# Patient Record
Sex: Female | Born: 1965 | Race: White | Hispanic: No | Smoking: Never smoker
Health system: Southern US, Community
[De-identification: ages and names within clinical notes are randomized; demographics above are authoritative.]

## PROBLEM LIST (undated history)

## (undated) DIAGNOSIS — K8020 Calculus of gallbladder without cholecystitis without obstruction: Secondary | ICD-10-CM

## (undated) DIAGNOSIS — K219 Gastro-esophageal reflux disease without esophagitis: Secondary | ICD-10-CM

## (undated) DIAGNOSIS — E119 Type 2 diabetes mellitus without complications: Secondary | ICD-10-CM

## (undated) HISTORY — DX: Gastro-esophageal reflux disease without esophagitis: K21.9

## (undated) HISTORY — PX: TOTAL VAGINAL HYSTERECTOMY: SHX2548

## (undated) HISTORY — PX: ABDOMINAL HYSTERECTOMY: SHX81

## (undated) HISTORY — DX: Type 2 diabetes mellitus without complications: E11.9

## (undated) HISTORY — PX: TUBAL LIGATION: SHX77

---

## 2013-01-31 ENCOUNTER — Emergency Department: Payer: Self-pay

## 2013-01-31 LAB — URINALYSIS, COMPLETE
Bilirubin,UR: NEGATIVE
Blood: NEGATIVE
Glucose,UR: NEGATIVE mg/dL (ref 0–75)
Ketone: NEGATIVE
Leukocyte Esterase: NEGATIVE
Nitrite: NEGATIVE
Ph: 6 (ref 4.5–8.0)
Protein: NEGATIVE
Squamous Epithelial: 1
WBC UR: <1 /HPF (ref 0–5)

## 2013-01-31 LAB — WET PREP, GENITAL

## 2013-01-31 LAB — COMPREHENSIVE METABOLIC PANEL
Albumin: 3.9 g/dL (ref 3.4–5.0)
Alkaline Phosphatase: 94 U/L (ref 50–136)
SGPT (ALT): 32 U/L (ref 12–78)
Total Protein: 8.1 g/dL (ref 6.4–8.2)

## 2013-01-31 LAB — CBC
MCH: 30.1 pg (ref 26.0–34.0)
Platelet: 315 10*3/uL (ref 150–440)
RBC: 4.73 10*6/uL (ref 3.80–5.20)
RDW: 13.8 % (ref 11.5–14.5)

## 2013-01-31 LAB — LIPASE, BLOOD: Lipase: 153 U/L (ref 73–393)

## 2014-12-30 ENCOUNTER — Emergency Department: Payer: Self-pay | Admitting: Emergency Medicine

## 2015-06-09 ENCOUNTER — Encounter (INDEPENDENT_AMBULATORY_CARE_PROVIDER_SITE_OTHER): Payer: Self-pay

## 2015-06-09 ENCOUNTER — Encounter: Payer: Self-pay | Admitting: *Deleted

## 2015-06-09 ENCOUNTER — Ambulatory Visit: Payer: Self-pay | Attending: Oncology | Admitting: *Deleted

## 2015-06-09 VITALS — BP 139/85 | HR 91 | Temp 96.7°F | Ht 67.0 in | Wt 239.3 lb

## 2015-06-09 DIAGNOSIS — N644 Mastodynia: Secondary | ICD-10-CM

## 2015-06-09 NOTE — Progress Notes (Signed)
Subjective:     Patient ID: Tamara Gibson, female   DOB: 04/06/1966, 49 y.o.   MRN: [id number]  HPI   Review of Systems     Objective:   Physical Exam  Pulmonary/Chest: Right breast exhibits tenderness. Right breast exhibits no inverted nipple, no mass, no nipple discharge and no skin change. Left breast exhibits no inverted nipple, no mass, no nipple discharge, no skin change and no tenderness. Breasts are symmetrical.         Assessment:     49 year old White female presents to [facility] for clinical breast exam and mammogram.  Patient complains of intermittent targeted right breast pain in the upper outer quadrant times six months.  States at times "I can't lay my arm by my side because it's so tender".  States she drinks 2 cups of coffee, and about 6 cokes a day.  Patient had a hysterectomy for fibroids and has only 1 ovary. Family history includes her mom with ovarian cancer at age 42.  She is still living.   On clinical breast exam the right breast is slightly tender to palpation.  There is no dominant mass, skin changes, nipple discharge or lymphadenopathy.  Taught self breast awareness. Patient has been screened for eligibility.  She does not have any insurance, Medicare or Medicaid.  She also meets financial eligibility.  Hand-out given on the Affordable Care Act.    Plan:     Will get bilateral diagnostic mammogram and ultrasound for targeted right breast pain.  Encouraged to decrease caffeine intake.  She is to call me back if her breast pain worsens, and we will re-evaluate at that time.  Will follow-up per protocol.

## 2015-06-09 NOTE — Patient Instructions (Signed)
Gave patient hand-out, Women Staying Healthy, Active and Well from [facility], with education on breast health, pap smears, heart and colon health. 

## 2015-06-11 ENCOUNTER — Ambulatory Visit
Admission: RE | Admit: 2015-06-11 | Discharge: 2015-06-11 | Disposition: A | Discharge status: Home / Self Care | Payer: Self-pay | Source: Ambulatory Visit | Attending: Oncology | Admitting: Oncology

## 2015-06-11 ENCOUNTER — Encounter: Payer: Self-pay | Admitting: *Deleted

## 2015-06-11 DIAGNOSIS — N644 Mastodynia: Secondary | ICD-10-CM

## 2015-06-11 NOTE — Progress Notes (Signed)
Letter mailed from the [facility] to inform patient of her normal mammogram results.  Patient is to follow-up with annual screening in one year.  HSIS to Christy. 

## 2016-05-25 ENCOUNTER — Emergency Department: Payer: Self-pay

## 2016-05-25 ENCOUNTER — Emergency Department
Admission: EM | Admit: 2016-05-25 | Discharge: 2016-05-25 | Disposition: A | Discharge status: Home / Self Care | Payer: Self-pay | Attending: Emergency Medicine | Admitting: Emergency Medicine

## 2016-05-25 ENCOUNTER — Encounter: Payer: Self-pay | Admitting: Emergency Medicine

## 2016-05-25 DIAGNOSIS — R0789 Other chest pain: Secondary | ICD-10-CM | POA: Insufficient documentation

## 2016-05-25 HISTORY — DX: Calculus of gallbladder without cholecystitis without obstruction: K80.20

## 2016-05-25 LAB — BASIC METABOLIC PANEL
Anion gap: 7 (ref 5–15)
BUN: 16 mg/dL (ref 6–20)
CALCIUM: 9.4 mg/dL (ref 8.9–10.3)
CHLORIDE: 108 mmol/L (ref 101–111)
CO2: 25 mmol/L (ref 22–32)
CREATININE: 0.7 mg/dL (ref 0.44–1.00)
GFR calc non Af Amer: >60 mL/min (ref >60)
GLUCOSE: 108 mg/dL — AB (ref 65–99)
Potassium: 3.7 mmol/L (ref 3.5–5.1)
Sodium: 140 mmol/L (ref 135–145)

## 2016-05-25 LAB — CBC
HCT: 42.3 % (ref 35.0–47.0)
Hemoglobin: 14.5 g/dL (ref 12.0–16.0)
MCH: 29.5 pg (ref 26.0–34.0)
MCHC: 34.3 g/dL (ref 32.0–36.0)
MCV: 85.9 fL (ref 80.0–100.0)
PLATELETS: 309 10*3/uL (ref 150–440)
RBC: 4.92 MIL/uL (ref 3.80–5.20)
RDW: 13.6 % (ref 11.5–14.5)
WBC: 8.3 10*3/uL (ref 3.6–11.0)

## 2016-05-25 LAB — TROPONIN I

## 2016-05-25 LAB — FIBRIN DERIVATIVES D-DIMER (ARMC ONLY): Fibrin derivatives D-dimer (ARMC): 356 (ref 0–499)

## 2016-05-25 NOTE — ED Triage Notes (Signed)
Pt comes into the ED via POV c/o left sided rib pain.  Patient denies injury to the rib but explains it as a sharp pain under the rib that has caused some mild shortness of breath, dizziness, and nausea.  Patient ambulated well to the room in NAD at this time.  States she thinks the area is slightly swollen and bruised.

## 2016-05-25 NOTE — ED Provider Notes (Signed)
[facility] Emergency Department Provider Note   ____________________________________________   First MD Initiated Contact with Patient 05/25/16 2055     (approximate)  I have reviewed the triage vital signs and the nursing notes.   HISTORY  Chief Complaint Chest Pain   HPI Tamara HootsCheryl Gibson is a 50 y.o. female without any chronic medical problems was presenting to the emergency department with 1 week of left-sided chest pain. Says the pain is cramping and worse with coughing and deep breathing. She says that it is worse at night and with movement. She says that she has had a sore throat recently and a mild cough. Denies any injury. Says that she has been taking NSAIDs which have been relieving the pain mildly. However, she cannot note any exact cause for the pain. She thinks that it may have been a muscle pull but after a week of symptoms she is unsure. She says that she also thought that she saw swelling this morning over the area of the left side of the chest where the pain is ongoing. She denies any radiation of pain. Denies any shortness of breath. No nausea, vomiting or diaphoresis.   Past Medical History:  Diagnosis Date  . Gallstones     There are no active problems to display for this patient.   Past Surgical History:  Procedure Laterality Date  . ABDOMINAL HYSTERECTOMY      Prior to Admission medications   Not on File    Allergies Review of patient's allergies indicates no known allergies.  Family History  Problem Relation Age of Onset  . Breast cancer Neg Hx     Social History Social History  Substance Use Topics  . Smoking status: Never Smoker  . Smokeless tobacco: Never Used  . Alcohol use Yes    Review of Systems Constitutional: No fever/chills Eyes: No visual changes. ENT: As above Cardiovascular: As above Respiratory: Denies shortness of breath. Gastrointestinal: No abdominal pain.  No nausea, no vomiting.  No  diarrhea.  No constipation. Genitourinary: Negative for dysuria. Musculoskeletal: Negative for back pain. Skin: Negative for rash. Neurological: Negative for headaches, focal weakness or numbness.  10-point ROS otherwise negative.  ____________________________________________   PHYSICAL EXAM:  VITAL SIGNS: ED Triage Vitals  Enc Vitals Group     BP 05/25/16 1806 (!) 160/87     Pulse Rate 05/25/16 1806 99     Resp 05/25/16 1806 20     Temp 05/25/16 1806 98.4 F (36.9 C)     Temp Source 05/25/16 1806 Oral     SpO2 05/25/16 1806 97 %     Weight 05/25/16 1807 240 lb (108.9 kg)     Height 05/25/16 1807 5\' 7"  (1.702 m)     Head Circumference --      Peak Flow --      Pain Score 05/25/16 1807 7     Pain Loc --      Pain Edu? --      Excl. in GC? --     Constitutional: Alert and oriented. Well appearing and in no acute distress. Eyes: Conjunctivae are normal. PERRL. EOMI. Head: Atraumatic. Nose: No congestion/rhinnorhea. Mouth/Throat: Mucous membranes are moist.  Oropharynx non-erythematous. Neck: No stridor.   Cardiovascular: Normal rate, regular rhythm. Grossly normal heart sounds.  Lateral tenderness over the lateral ninth and 10th ribs without any overlying ecchymosis. No crepitus. Respiratory: Normal respiratory effort.  No retractions. Lungs CTAB. Gastrointestinal: Soft and nontender. No distention.  Musculoskeletal: No lower  extremity tenderness nor edema.  No joint effusions. Neurologic:  Normal speech and language. No gross focal neurologic deficits are appreciated. Skin:  Skin is warm, dry and intact. No rash noted. Psychiatric: Mood and affect are normal. Speech and behavior are normal.  ____________________________________________   LABS (all labs ordered are listed, but only abnormal results are displayed)  Labs Reviewed  BASIC METABOLIC PANEL - Abnormal; Notable for the following:       Result Value   Glucose, Bld 108 (*)    All other components within  normal limits  CBC  TROPONIN I  FIBRIN DERIVATIVES D-DIMER ([facility] ONLY)   ____________________________________________  EKG  ED ECG REPORT I, Arelia LongestSchaevitz,  Taziah Difatta M, the attending physician, personally viewed and interpreted this ECG.   Date: 05/25/2016  EKG Time: 1817  Rate: 90  Rhythm: normal sinus rhythm  Axis: Normal  Intervals:none  ST&T Change: No ST segment elevation or depression. No abnormal T-wave inversion.  ____________________________________________  RADIOLOGY  DG Chest 2 View (Accession [id number]) (Order [id number])  Imaging  Date: 05/25/2016 Department: [facility] EMERGENCY DEPARTMENT Released By: Domenick GongAmy K Moffitt, RN (auto-released) Authorizing: Nita Sicklearolina Veronese, MD  PACS Images   Show images for DG Chest 2 View  Study Result   CLINICAL DATA:  Left-sided rib pain. Mild shortness of breath and dizziness.  EXAM: CHEST  2 VIEW  COMPARISON:  None.  FINDINGS: The heart size and mediastinal contours are within normal limits. Both lungs are clear. The visualized skeletal structures are unremarkable.  IMPRESSION: Negative two view chest x-ray.   Electronically Signed   By: Marin Robertshristopher  Mattern M.D.   On: 05/25/2016 18:38     ____________________________________________   PROCEDURES  Procedure(s) performed:   Procedures  Critical Care performed:   ____________________________________________   INITIAL IMPRESSION / ASSESSMENT AND PLAN / ED COURSE  Pertinent labs & imaging results that were available during my care of the patient were reviewed by me and considered in my medical decision making (see chart for details).  ----------------------------------------- 11:06 PM on 05/25/2016 -----------------------------------------  Patient is resting currently at this time. Very reassuring lab work including a negative d-dimer. Likely chest wall pain. Splint results the patient as well as the diagnosis. She'll continue  to take ibuprofen and we'll try to salvage addition such as BenGay or icy hot. She is understanding of the plan and willing to comply.  Clinical Course     ____________________________________________   FINAL CLINICAL IMPRESSION(S) / ED DIAGNOSES  Chest wall pain.    NEW MEDICATIONS STARTED DURING THIS VISIT:  New Prescriptions   No medications on file     Note:  This document was prepared using Dragon voice recognition software and may include unintentional dictation errors.    Myrna Blazeravid Matthew Parish Augustine, MD 05/25/16 [phone]

## 2016-05-25 NOTE — ED Notes (Signed)
Pt in via triage with complaints of pain "under left rib" x approximately one week.  Pt initially thought she pulled something, but pain has just gotten worse.  Pt states pain is worse when she coughs or sneezes, states difficulty laying on that side at night due to pain.  Pt denies any pain at this time.  Pt A/Ox4, ambulatory to room, no immediate distress at this time.

## 2016-09-06 IMAGING — US ABDOMEN ULTRASOUND LIMITED
1 series · 14 of 25 positions shown · non-contrast
Comparison: None.

CLINICAL DATA: Right upper quadrant pain, known gallstones.

EXAM:
US ABDOMEN LIMITED - RIGHT UPPER QUADRANT

[Series 1: abdomen ultrasound limited · 0.21mm/px · 14 of 52 slices shown]
[im 1/52]
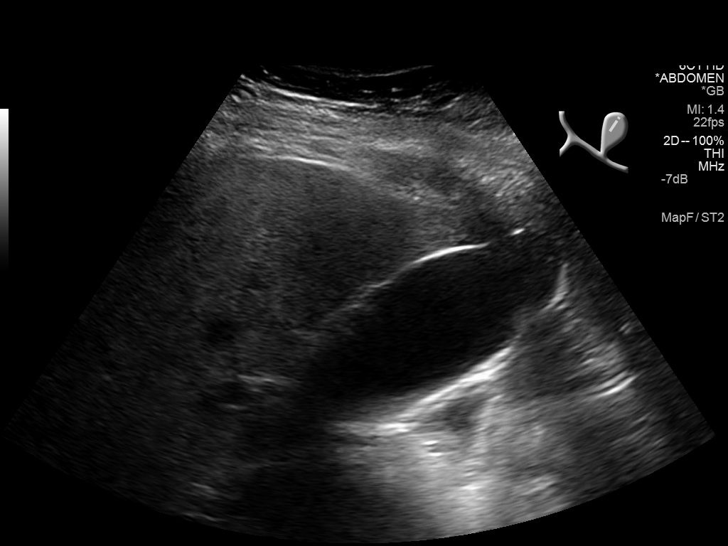
[im 5/52]
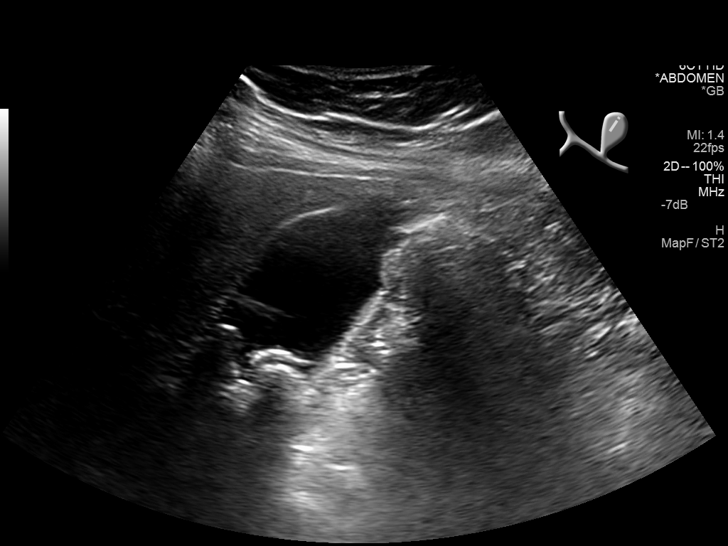
[im 9/52]
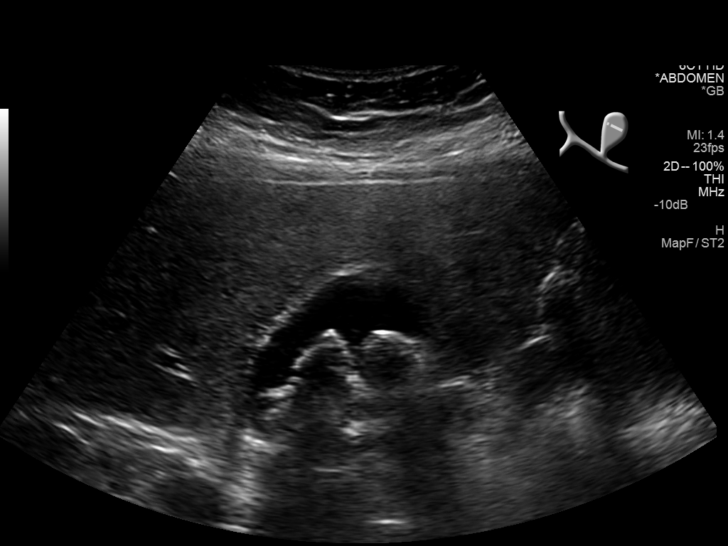
[im 13/52]
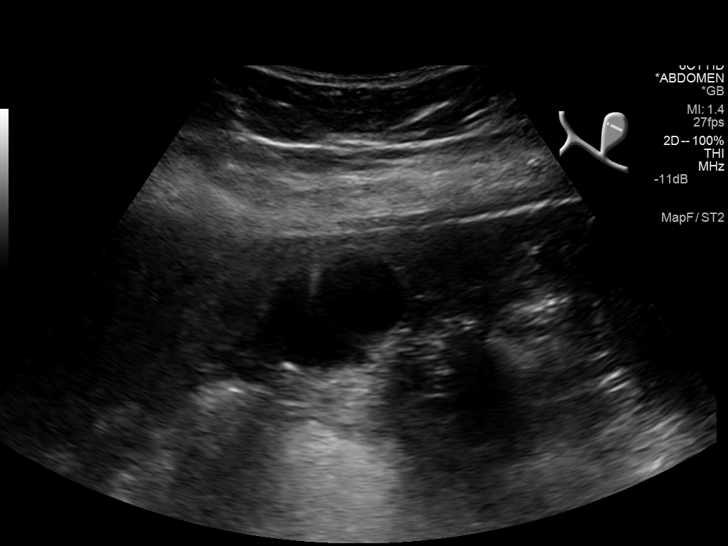
[im 18/52]
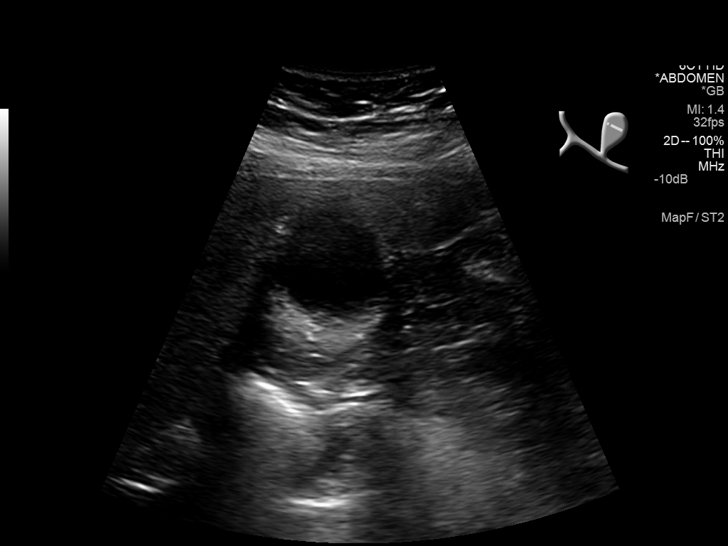
[im 20/52]
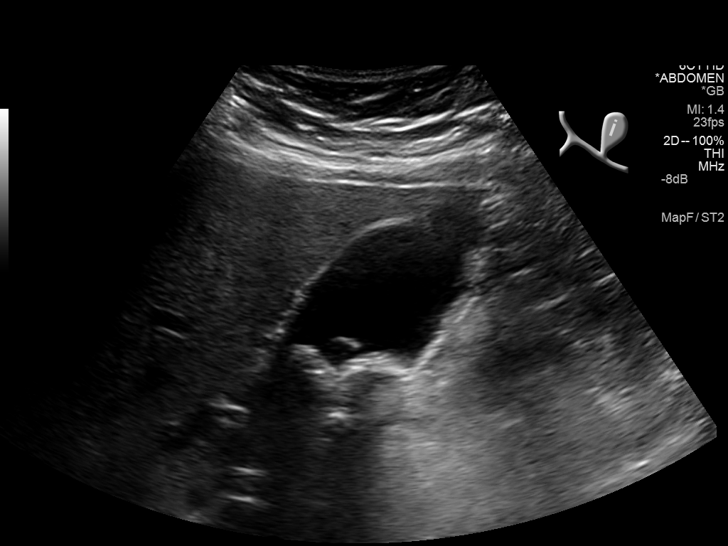
[im 24/52]
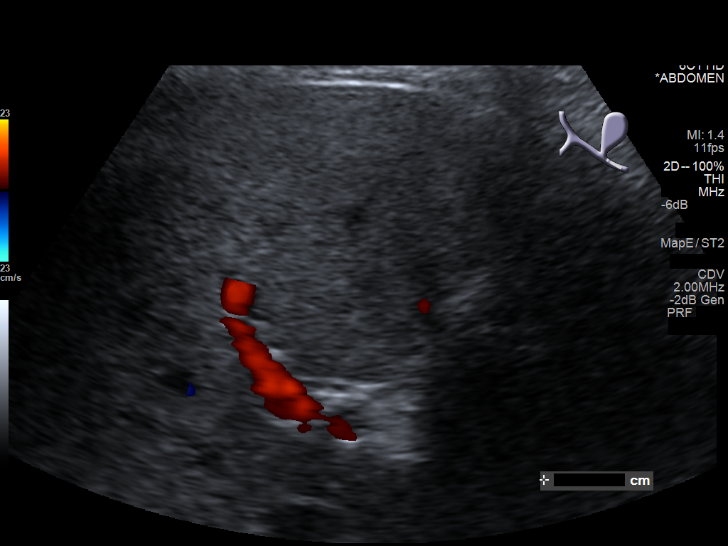
[im 28/52]
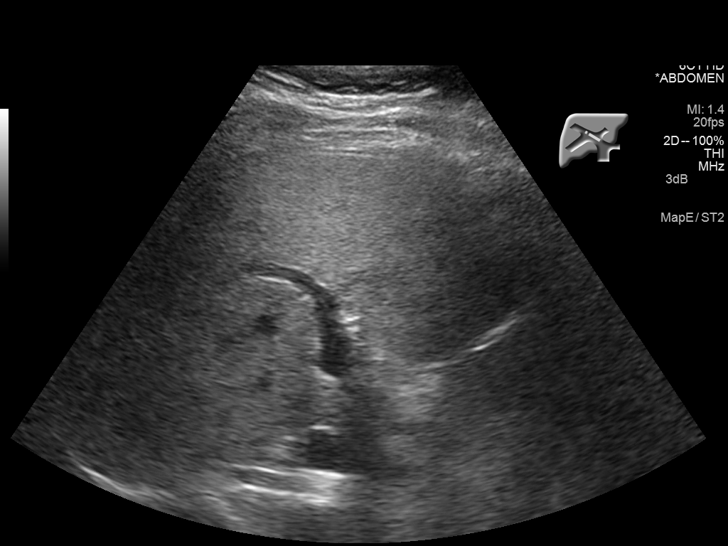
[im 32/52]
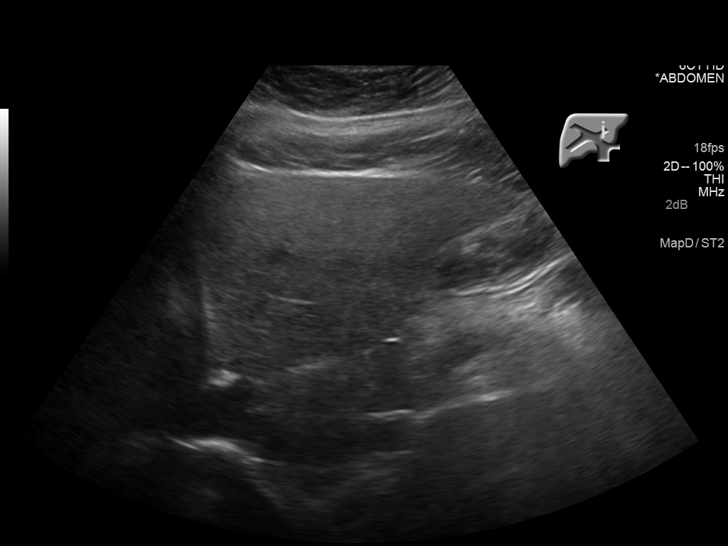
[im 35/52]
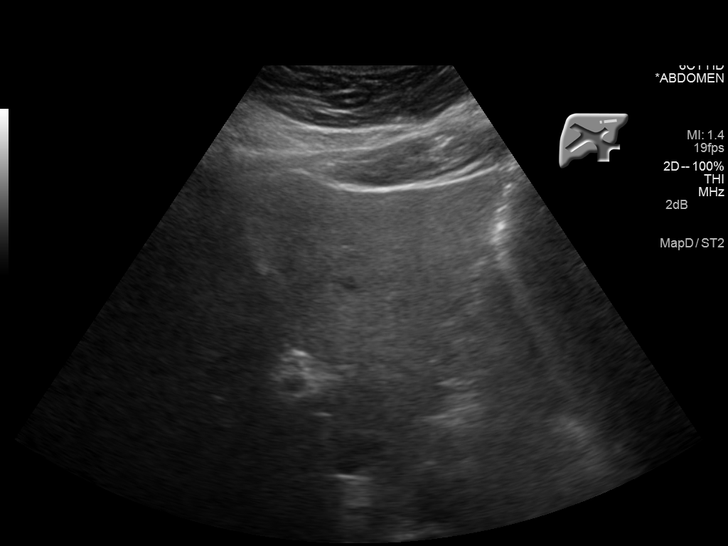
[im 39/52]
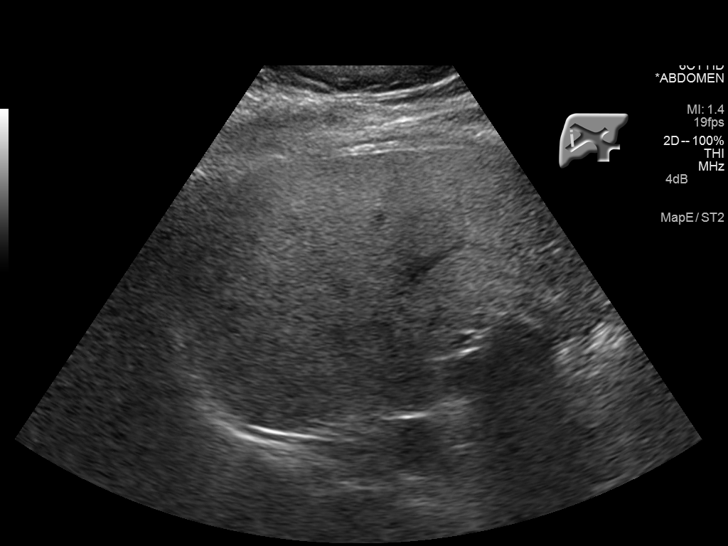
[im 43/52]
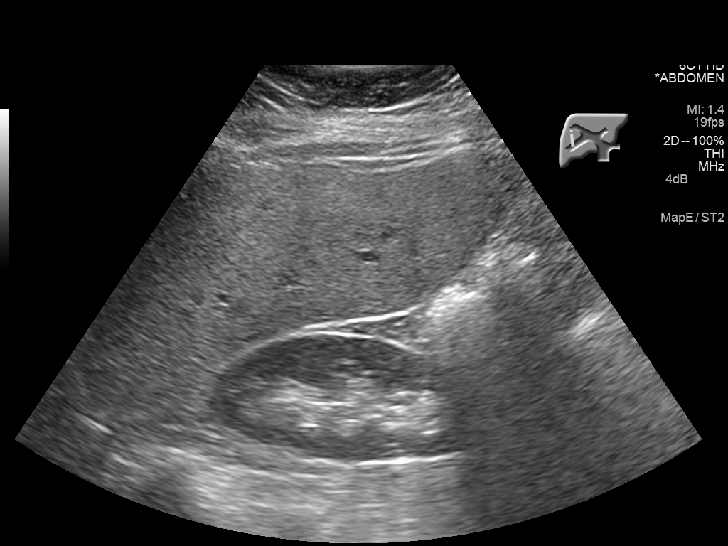
[im 47/52]
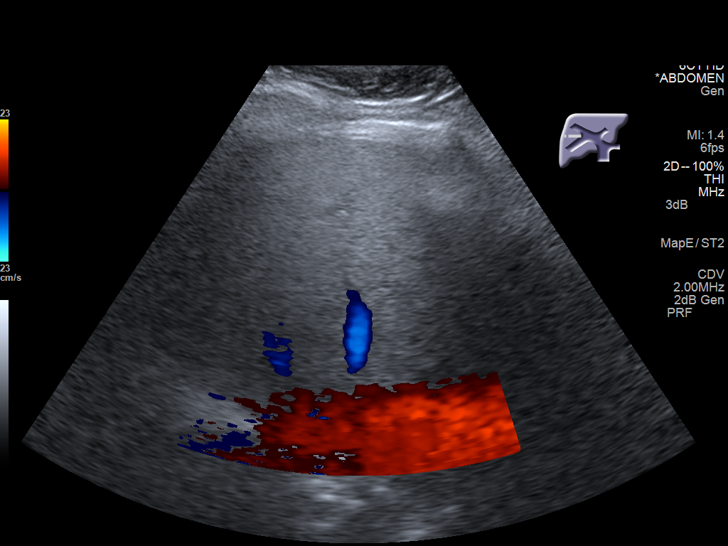
[im 52/52]
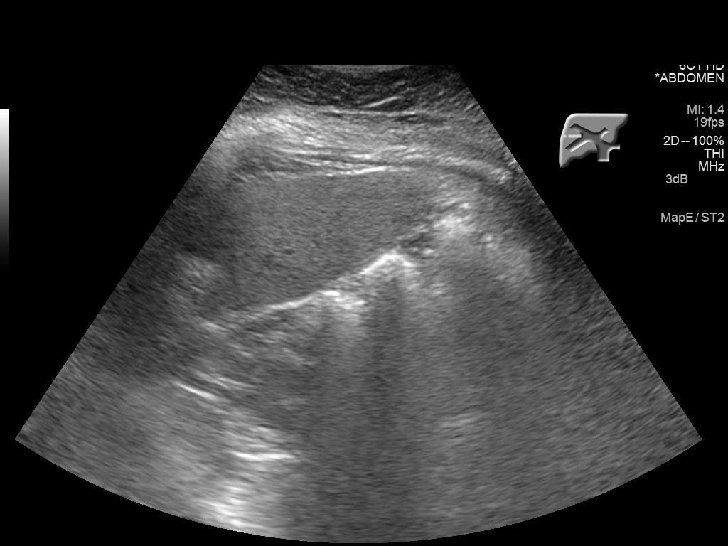

[14 of 25 positions shown; findings below may reference images not displayed]

FINDINGS: Gallbladder:

Gallstones are identified within the gallbladder. No wall thickening
visualized. No sonographic Murphy sign noted.

Common bile duct:

Diameter: 2.9 mm

Liver:

No focal lesion identified. There is diffuse increased echotexture
of the liver.
IMPRESSION: Cholelithiasis without sonographic evidence of acute cholecystitis.

Fatty infiltration of liver.

## 2018-01-31 IMAGING — CR DG CHEST 2V
1 series · 2 of 2 positions shown · non-contrast
Comparison: None.

CLINICAL DATA: Left-sided rib pain. Mild shortness of breath and
dizziness.

EXAM:
CHEST  2 VIEW

[Series 1: dg chest 2 view · 0.14mm/px · 2 of 2 slices shown]
[im 1/2]
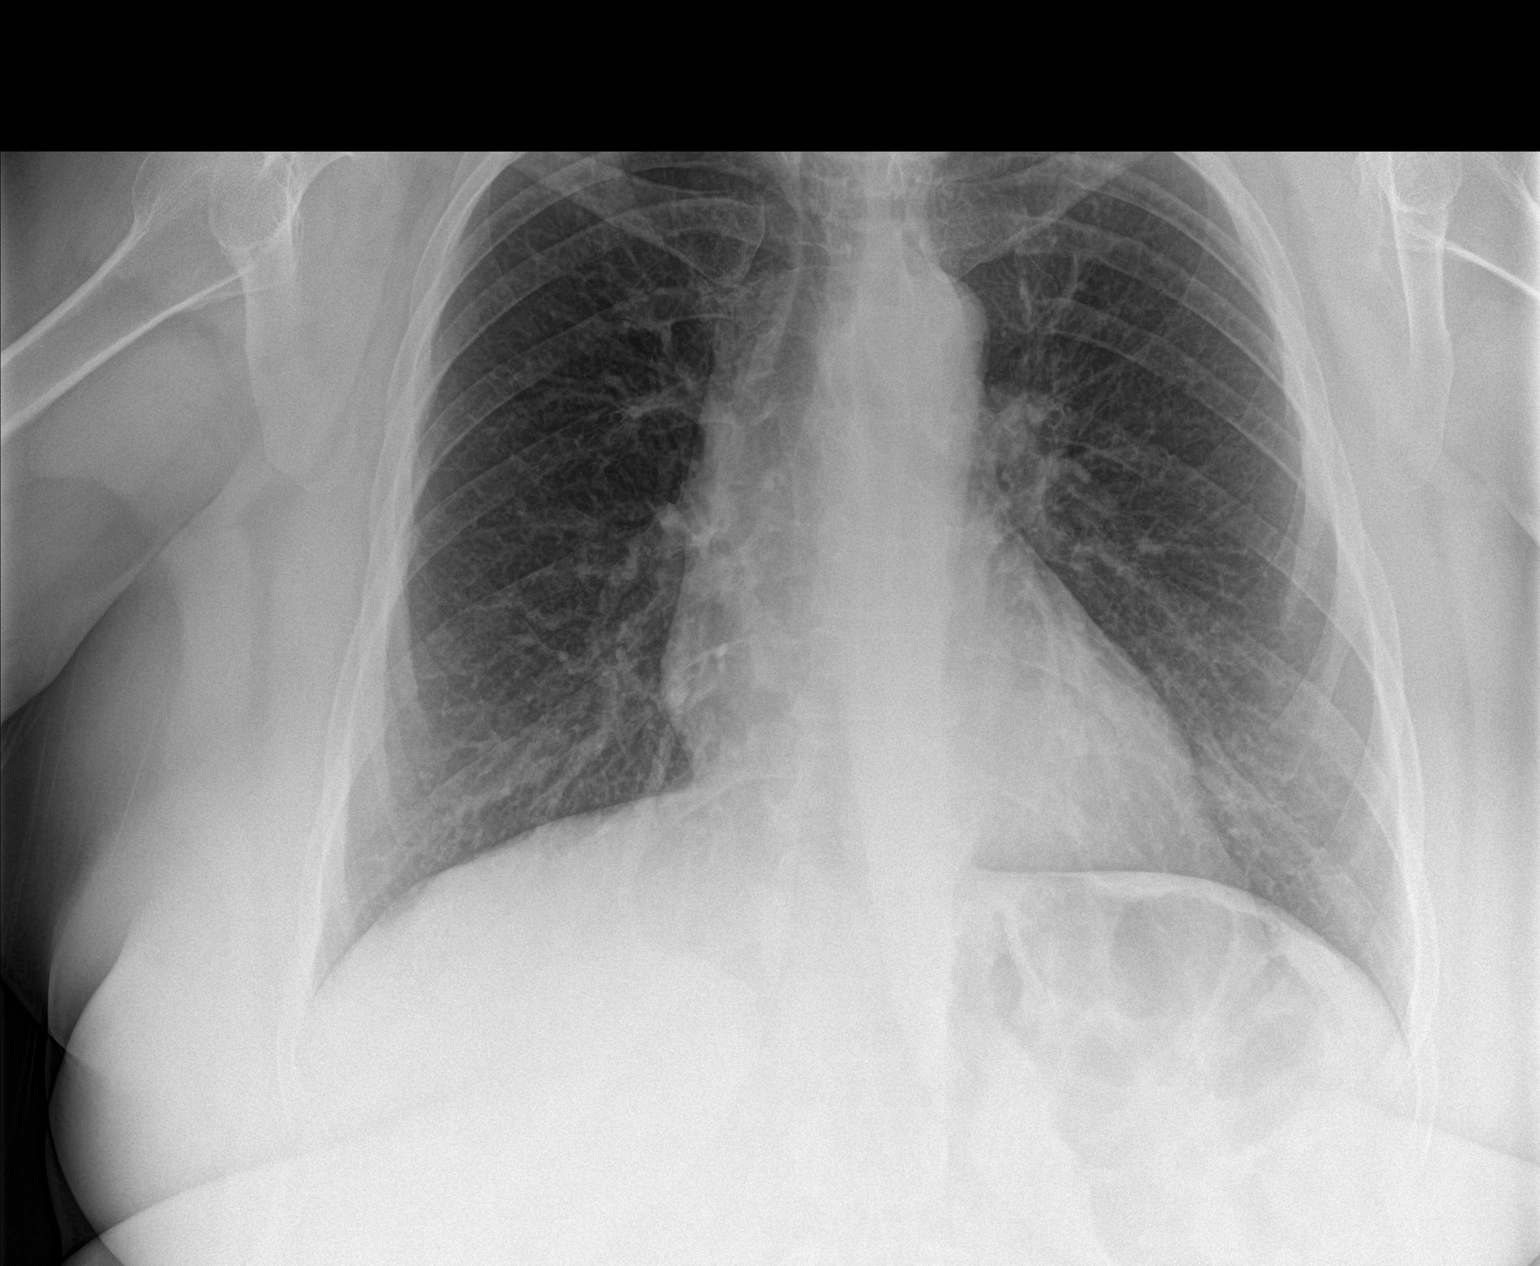
[im 2/2]
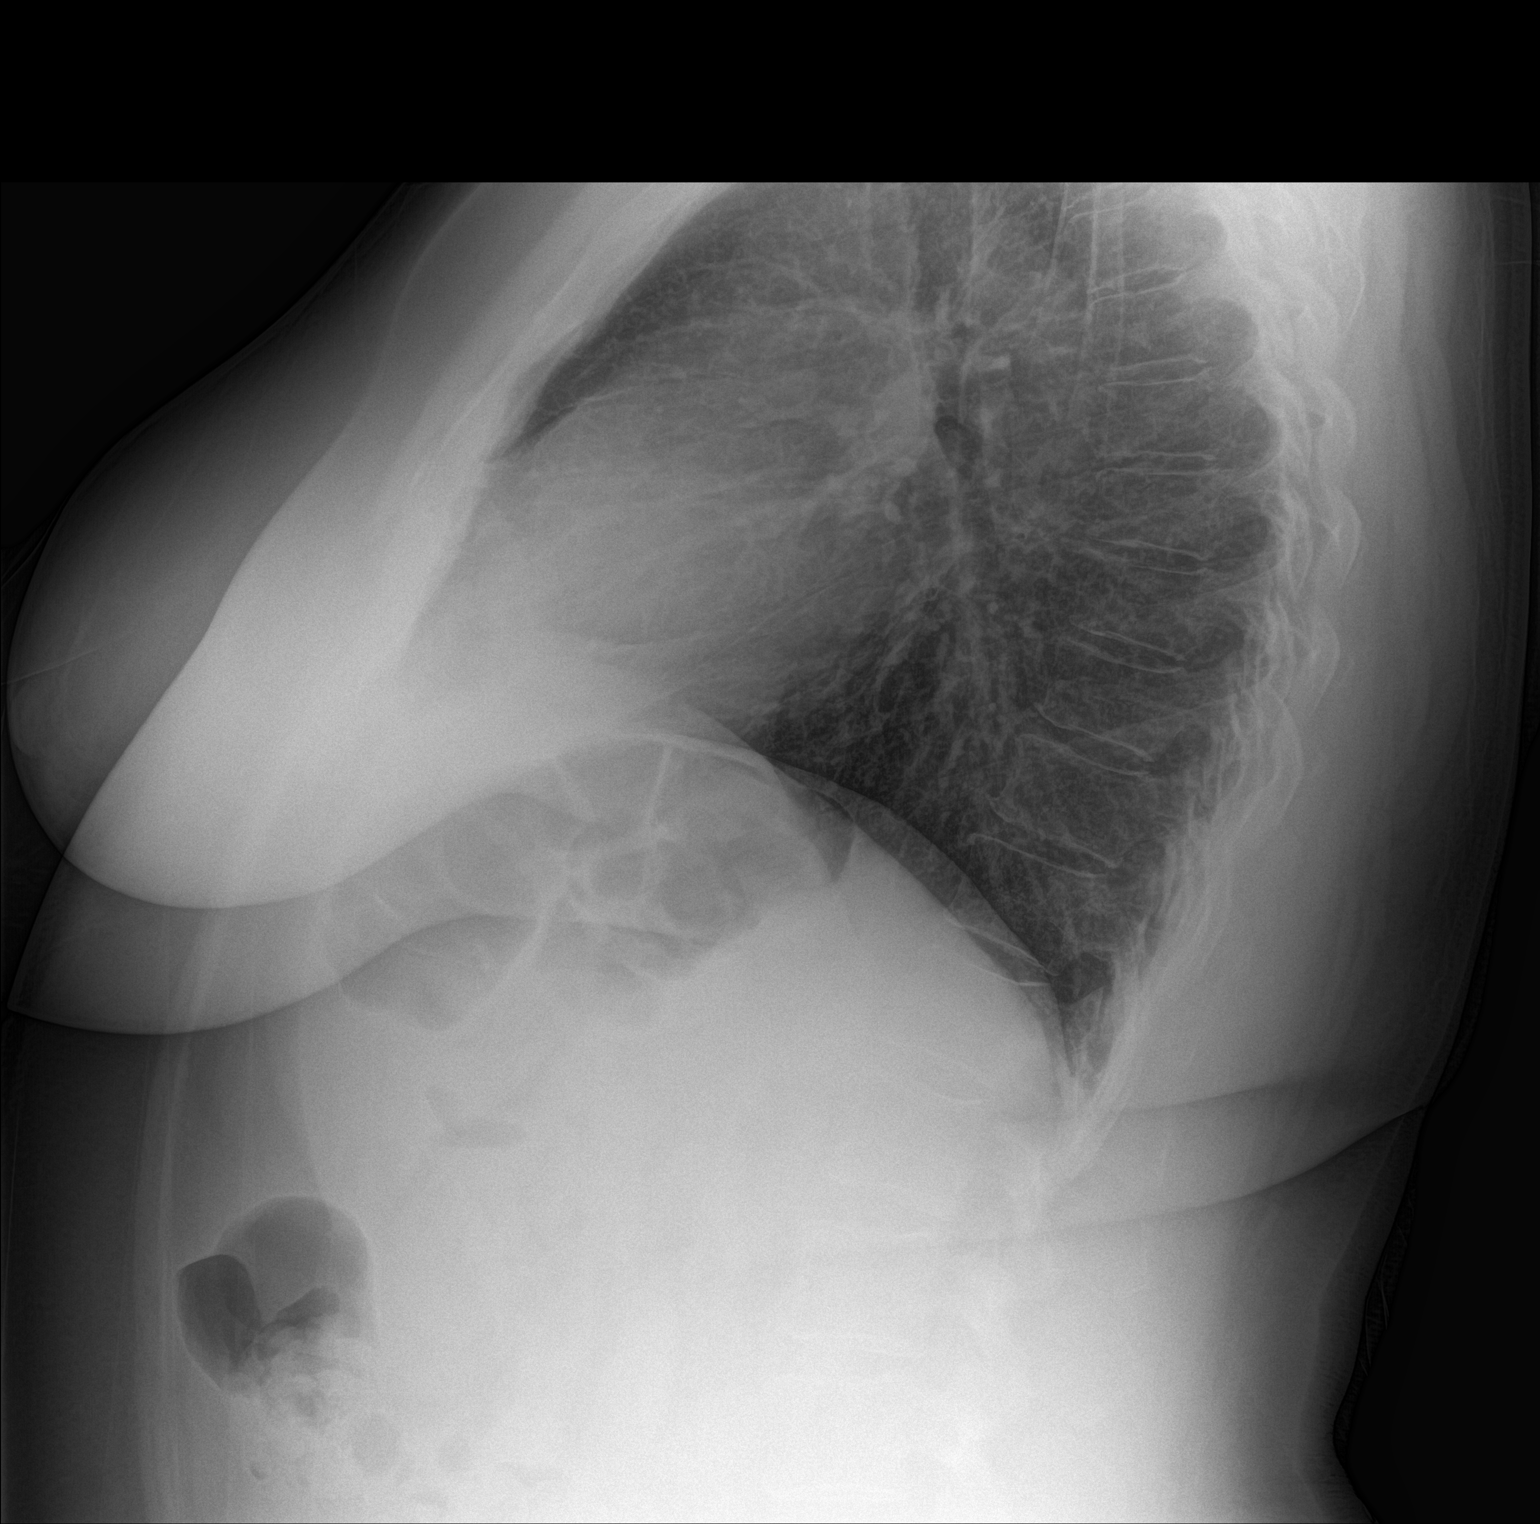

[2 of 2 positions shown; findings below may reference images not displayed]

FINDINGS: The heart size and mediastinal contours are within normal limits.
Both lungs are clear. The visualized skeletal structures are
unremarkable.
IMPRESSION: Negative two view chest x-ray.

## 2023-04-26 ENCOUNTER — Ambulatory Visit: Payer: Self-pay | Admitting: Family

## 2023-05-04 NOTE — Telephone Encounter (Signed)
Mort Sawyers, FNP  Marcell Barlow Amy New patient no show. Please consider dismissal  LVM with patient, FYI entered not to reschedule with T. Alfonse Alpers

## 2023-11-01 ENCOUNTER — Encounter: Payer: Self-pay | Admitting: Family

## 2023-11-01 ENCOUNTER — Ambulatory Visit (INDEPENDENT_AMBULATORY_CARE_PROVIDER_SITE_OTHER): Payer: BC Managed Care – PPO | Admitting: Family

## 2023-11-01 VITALS — BP 159/89 | HR 83 | Temp 98.3°F | Ht 67.0 in | Wt 260.0 lb

## 2023-11-01 DIAGNOSIS — E559 Vitamin D deficiency, unspecified: Secondary | ICD-10-CM | POA: Diagnosis not present

## 2023-11-01 DIAGNOSIS — Z803 Family history of malignant neoplasm of breast: Secondary | ICD-10-CM

## 2023-11-01 DIAGNOSIS — Z7984 Long term (current) use of oral hypoglycemic drugs: Secondary | ICD-10-CM | POA: Diagnosis not present

## 2023-11-01 DIAGNOSIS — E782 Mixed hyperlipidemia: Secondary | ICD-10-CM

## 2023-11-01 DIAGNOSIS — Z1211 Encounter for screening for malignant neoplasm of colon: Secondary | ICD-10-CM

## 2023-11-01 DIAGNOSIS — K219 Gastro-esophageal reflux disease without esophagitis: Secondary | ICD-10-CM

## 2023-11-01 DIAGNOSIS — G629 Polyneuropathy, unspecified: Secondary | ICD-10-CM

## 2023-11-01 DIAGNOSIS — E1159 Type 2 diabetes mellitus with other circulatory complications: Secondary | ICD-10-CM | POA: Insufficient documentation

## 2023-11-01 DIAGNOSIS — I152 Hypertension secondary to endocrine disorders: Secondary | ICD-10-CM

## 2023-11-01 DIAGNOSIS — Z114 Encounter for screening for human immunodeficiency virus [HIV]: Secondary | ICD-10-CM

## 2023-11-01 DIAGNOSIS — Z1231 Encounter for screening mammogram for malignant neoplasm of breast: Secondary | ICD-10-CM

## 2023-11-01 DIAGNOSIS — E119 Type 2 diabetes mellitus without complications: Secondary | ICD-10-CM

## 2023-11-01 DIAGNOSIS — Z8719 Personal history of other diseases of the digestive system: Secondary | ICD-10-CM

## 2023-11-01 DIAGNOSIS — Z1159 Encounter for screening for other viral diseases: Secondary | ICD-10-CM | POA: Diagnosis not present

## 2023-11-01 LAB — LIPID PANEL
Cholesterol: 168 mg/dL (ref 0–200)
HDL: 46.1 mg/dL (ref >39.00)
LDL Cholesterol: 80 mg/dL (ref 0–99)
NonHDL: 122.23
Total CHOL/HDL Ratio: 4
Triglycerides: 212 mg/dL — ABNORMAL HIGH (ref 0.0–149.0)
VLDL: 42.4 mg/dL — ABNORMAL HIGH (ref 0.0–40.0)

## 2023-11-01 LAB — MICROALBUMIN / CREATININE URINE RATIO
Creatinine,U: 47.7 mg/dL
Microalb Creat Ratio: 1.5 mg/g (ref 0.0–30.0)
Microalb, Ur: <0.7 mg/dL (ref 0.0–1.9)

## 2023-11-01 LAB — COMPREHENSIVE METABOLIC PANEL
ALT: 20 U/L (ref 0–35)
AST: 16 U/L (ref 0–37)
Albumin: 4.5 g/dL (ref 3.5–5.2)
Alkaline Phosphatase: 96 U/L (ref 39–117)
BUN: 12 mg/dL (ref 6–23)
CO2: 26 meq/L (ref 19–32)
Calcium: 9.7 mg/dL (ref 8.4–10.5)
Chloride: 106 meq/L (ref 96–112)
Creatinine, Ser: 0.74 mg/dL (ref 0.40–1.20)
GFR: 88.22 mL/min (ref >60.00)
Glucose, Bld: 157 mg/dL — ABNORMAL HIGH (ref 70–99)
Potassium: 4.3 meq/L (ref 3.5–5.1)
Sodium: 140 meq/L (ref 135–145)
Total Bilirubin: 0.5 mg/dL (ref 0.2–1.2)
Total Protein: 7.6 g/dL (ref 6.0–8.3)

## 2023-11-01 LAB — MAGNESIUM: Magnesium: 1.9 mg/dL (ref 1.5–2.5)

## 2023-11-01 LAB — HEMOGLOBIN A1C: Hgb A1c MFr Bld: 8.5 % — ABNORMAL HIGH (ref 4.6–6.5)

## 2023-11-01 LAB — VITAMIN B12: Vitamin B-12: 396 pg/mL (ref 211–911)

## 2023-11-01 LAB — VITAMIN D 25 HYDROXY (VIT D DEFICIENCY, FRACTURES): VITD: 15.94 ng/mL — ABNORMAL LOW (ref 30.00–100.00)

## 2023-11-01 MED ORDER — OMEPRAZOLE 20 MG PO CPDR: 20.0000 mg | DELAYED_RELEASE_CAPSULE | Freq: Every day | ORAL | 3 refills | Status: AC

## 2023-11-01 MED ORDER — LOSARTAN POTASSIUM 50 MG PO TABS: 50.0000 mg | ORAL_TABLET | Freq: Every day | ORAL | 3 refills | Status: DC

## 2023-11-01 MED ORDER — METFORMIN HCL ER 500 MG PO TB24: 500.0000 mg | ORAL_TABLET | Freq: Every day | ORAL | 3 refills | Status: DC

## 2023-11-01 NOTE — Assessment & Plan Note (Signed)
Repeat magnesium

## 2023-11-01 NOTE — Assessment & Plan Note (Addendum)
Ordered hga1c today pending results. Work on diabetic diet and exercise as tolerated. Yearly foot exam, and annual eye exam.  Restart metformin 500 mg XR once daily

## 2023-11-01 NOTE — Patient Instructions (Addendum)
  A referral was placed today for a colonoscopy. Please let us know if you have not heard back within 2 weeks about the referral.  ------------------------------------ I have sent an electronic order over to your preferred location for the following:   []   2D Mammogram  [x]   3D Mammogram  []   Bone Density   Please give this center a call to get scheduled at your convenience.  [x]   [facility]  [address] [place] [address]  [phone]  Make sure to wear two piece  clothing  No lotions powders or deodorants the day of the appointment Make sure to bring picture ID and insurance card.  Bring list of medications you are currently taking including any supplements.   ------------------------------------

## 2023-11-01 NOTE — Assessment & Plan Note (Signed)
Pt advised of the following:  Continue medication as prescribed. Monitor blood pressure periodically and/or when you feel symptomatic. Goal is <130/90 on average. Ensure that you have rested for 30 minutes prior to checking your blood pressure. Record your readings and bring them to your next visit if necessary.work on a low sodium diet. Start losartan 50 mg once daily

## 2023-11-01 NOTE — Assessment & Plan Note (Signed)
Ordered lipid panel, pending results. Work on low cholesterol diet and exercise as tolerated  

## 2023-11-01 NOTE — Progress Notes (Unsigned)
New Patient Office Visit  Subjective:  Patient ID: Tamara Gibson, female    DOB: 1966/08/26  Age: 58 y.o. MRN: [id number]  CC:  Chief Complaint  Patient presents with  . Establish Care    HPI Tamara Gibson is here to establish care as a new patient.  Oriented to practice routines and expectations.  Prior provider was: [facility] has not seen pcp in over one year.   Pt is with some acute concerns.  Constipation with diarrhea alternating. This is regular for her.   chronic concerns:  Mixed hyperlipidemia: she is on crestor, tolerating well.   On aspirin, not sure why she is on this? Was in hospital last year and given upon discharge. Work up per her was negative. Do not have reports. Heart work up negative, was thought to be GERD. She is on omeprazole 20 mg once daily.   HTN: has noticed at home elevated blood pressures. Her average at home appears to be about 150/90. Denies chest pain and or palpitations.   Diabetes type 2: at home fasting glucose is the high 150's used to be on metformin but ran out because she was overdue for her follow up appointment. Did used to tolerate the low dose, could not increase due to GI upset.   At times has blurred vision, not current. Due for an eye exam today. Has a floater in her right eye that also needs investigating. Left eye at times will get black spots.   TDAP not in the last ten years. Doesn't want today Shingles vaccine: not today   Up to date on  Exercise, no routine at this time.  Regular diet.   A few months ago started to notice right lower back pain. Not with radiation of pain down the leg. Has had sciatica before and doesn't feel similar. Worse with standing or lying down. The pain is aching but at times will feel stabbing especially with standing up. No leg weakness. Tylenol or ibuprofen does give her some mild relief. No known injury or trauma.   ROS: Negative unless specifically indicated above in HPI.   Current  Outpatient Medications:  .  aspirin EC 81 MG tablet, Take 81 mg by mouth daily. Swallow whole., Disp: , Rfl:  .  losartan (COZAAR) 50 MG tablet, Take 1 tablet (50 mg total) by mouth daily., Disp: 90 tablet, Rfl: 3 .  omeprazole (PRILOSEC) 20 MG capsule, Take 1 capsule (20 mg total) by mouth daily., Disp: 90 capsule, Rfl: 3 .  rosuvastatin (CRESTOR) 10 MG tablet, Take 10 mg by mouth daily., Disp: , Rfl:  Past Medical History:  Diagnosis Date  . Diabetes mellitus without complication (HCC) 2022  . Gallstones   . GERD (gastroesophageal reflux disease) 2023   Past Surgical History:  Procedure Laterality Date  . TOTAL VAGINAL HYSTERECTOMY     still with one ovary unsure which one  . TUBAL LIGATION  1992    Objective:   Today's Vitals: BP (!) 159/89 (BP Location: Left Arm, Patient Position: Sitting, Cuff Size: Large)   Pulse 83   Temp 98.3 F (36.8 C) (Temporal)   Ht 5\' 7"  (1.702 m)   Wt 260 lb (117.9 kg)   LMP 03/08/2004 (Approximate)   SpO2 98%   BMI 40.72 kg/m   Physical Exam Constitutional:      General: She is not in acute distress.    Appearance: Normal appearance. She is normal weight. She is not ill-appearing, toxic-appearing or diaphoretic.  HENT:  Head: Normocephalic.  Cardiovascular:     Rate and Rhythm: Normal rate.  Pulmonary:     Effort: Pulmonary effort is normal.  Musculoskeletal:        General: Normal range of motion.     Thoracic back: Normal.     Lumbar back: Normal.  Neurological:     General: No focal deficit present.     Mental Status: She is alert and oriented to person, place, and time. Mental status is at baseline.  Psychiatric:        Mood and Affect: Mood normal.        Behavior: Behavior normal.        Thought Content: Thought content normal.        Judgment: Judgment normal.    Assessment & Plan:  Gastroesophageal reflux disease without esophagitis Assessment & Plan: Cont omeprazole Did talk about long term use Try to decrease and  or avoid spicy foods, fried fatty foods, and also caffeine and chocolate as these can increase heartburn symptoms.  May have to consider EGD in future if can not improve symptoms without medication   Orders: -     Omeprazole; Take 1 capsule (20 mg total) by mouth daily.  Dispense: 90 capsule; Refill: 3  Controlled type 2 diabetes mellitus without complication, without long-term current use of insulin (HCC) Assessment & Plan: Ordered hga1c today pending results. Work on diabetic diet and exercise as tolerated. Yearly foot exam, and annual eye exam.    Orders: -     Comprehensive metabolic panel -     Microalbumin / creatinine urine ratio -     Hemoglobin A1c  Mixed hyperlipidemia Assessment & Plan: Ordered lipid panel, pending results. Work on low cholesterol diet and exercise as tolerated   Orders: -     Lipid panel -     Comprehensive metabolic panel  Vitamin D deficiency -     VITAMIN D 25 Hydroxy (Vit-D Deficiency, Fractures)  Hypomagnesemia Assessment & Plan: Repeat magnesium   Orders: -     Magnesium  Hypertension associated with diabetes (HCC) Assessment & Plan: Pt advised of the following:  Continue medication as prescribed. Monitor blood pressure periodically and/or when you feel symptomatic. Goal is <130/90 on average. Ensure that you have rested for 30 minutes prior to checking your blood pressure. Record your readings and bring them to your next visit if necessary.work on a low sodium diet. Start losartan 50 mg once daily   Orders: -     Losartan Potassium; Take 1 tablet (50 mg total) by mouth daily.  Dispense: 90 tablet; Refill: 3  History of gallstones  Family history of breast cancer -     3D Screening Mammogram, Left and Right; Future  Screening mammogram for breast cancer -     3D Screening Mammogram, Left and Right; Future  Screening for colon cancer -     Ambulatory referral to Gastroenterology  Encounter for hepatitis C screening test for low  risk patient -     Hepatitis panel, acute  Screening for HIV (human immunodeficiency virus) -     HIV Antibody (routine testing w rflx)  Neuropathy -     Vitamin B12    Follow-up: Return in about 2 weeks (around 11/15/2023) for f/u blood pressure, f/u CPE.   Mort Sawyers, FNP

## 2023-11-01 NOTE — Assessment & Plan Note (Signed)
Cont omeprazole Did talk about long term use Try to decrease and or avoid spicy foods, fried fatty foods, and also caffeine and chocolate as these can increase heartburn symptoms.  May have to consider EGD in future if can not improve symptoms without medication

## 2023-11-02 ENCOUNTER — Other Ambulatory Visit: Payer: Self-pay | Admitting: Family

## 2023-11-02 ENCOUNTER — Encounter: Payer: Self-pay | Admitting: Family

## 2023-11-02 DIAGNOSIS — E119 Type 2 diabetes mellitus without complications: Secondary | ICD-10-CM

## 2023-11-02 DIAGNOSIS — E559 Vitamin D deficiency, unspecified: Secondary | ICD-10-CM

## 2023-11-02 LAB — HIV ANTIBODY (ROUTINE TESTING W REFLEX): HIV 1&2 Ab, 4th Generation: NONREACTIVE

## 2023-11-02 LAB — HEPATITIS PANEL, ACUTE
Hep A IgM: NONREACTIVE
Hep B C IgM: NONREACTIVE
Hepatitis B Surface Ag: NONREACTIVE
Hepatitis C Ab: NONREACTIVE

## 2023-11-02 MED ORDER — CHOLECALCIFEROL 1.25 MG (50000 UT) PO TABS: 1.0000 | ORAL_TABLET | ORAL | 0 refills | Status: AC

## 2023-11-03 MED ORDER — OZEMPIC (0.25 OR 0.5 MG/DOSE) 2 MG/3ML ~~LOC~~ SOPN: PEN_INJECTOR | SUBCUTANEOUS | 0 refills | Status: DC

## 2023-11-03 NOTE — Addendum Note (Signed)
Addended by: Mort Sawyers on: 11/03/2023 04:13 PM   Modules accepted: Orders

## 2023-11-04 ENCOUNTER — Telehealth: Payer: Self-pay

## 2023-11-04 ENCOUNTER — Other Ambulatory Visit (HOSPITAL_COMMUNITY): Payer: Self-pay

## 2023-11-04 ENCOUNTER — Other Ambulatory Visit: Payer: Self-pay | Admitting: *Deleted

## 2023-11-04 ENCOUNTER — Telehealth: Payer: Self-pay | Admitting: *Deleted

## 2023-11-04 DIAGNOSIS — Z1211 Encounter for screening for malignant neoplasm of colon: Secondary | ICD-10-CM

## 2023-11-04 MED ORDER — NA SULFATE-K SULFATE-MG SULF 17.5-3.13-1.6 GM/177ML PO SOLN: 1.0000 | Freq: Once | ORAL | 0 refills | Status: AC

## 2023-11-04 NOTE — Telephone Encounter (Signed)
Gastroenterology Pre-Procedure Review  Request Date: 12/05/2023 Requesting Physician: Dr. Servando Snare  PATIENT REVIEW QUESTIONS: The patient responded to the following health history questions as indicated:    1. Are you having any GI issues? no 2. Do you have a personal history of Polyps? no 3. Do you have a family history of Colon Cancer or Polyps? no 4. Diabetes Mellitus? yes (taking metformin and Ozempic) 5. Joint replacements in the past 12 months?no 6. Major health problems in the past 3 months?no 7. Any artificial heart valves, MVP, or defibrillator?no    MEDICATIONS & ALLERGIES:    Patient reports the following regarding taking any anticoagulation/antiplatelet therapy:   Plavix, Coumadin, Eliquis, Xarelto, Lovenox, Pradaxa, Brilinta, or Effient? no Aspirin? no  Patient confirms/reports the following medications:  Current Outpatient Medications  Medication Sig Dispense Refill   aspirin EC 81 MG tablet Take 81 mg by mouth daily. Swallow whole.     Cholecalciferol 1.25 MG (50000 UT) TABS Take 1 tablet by mouth once a week. 12 tablet 0   losartan (COZAAR) 50 MG tablet Take 1 tablet (50 mg total) by mouth daily. 90 tablet 3   metFORMIN (GLUCOPHAGE-XR) 500 MG 24 hr tablet Take 1 tablet (500 mg total) by mouth daily with breakfast. 90 tablet 3   omeprazole (PRILOSEC) 20 MG capsule Take 1 capsule (20 mg total) by mouth daily. 90 capsule 3   rosuvastatin (CRESTOR) 10 MG tablet Take 10 mg by mouth daily.     Semaglutide,0.25 or 0.5MG /DOS, (OZEMPIC, 0.25 OR 0.5 MG/DOSE,) 2 MG/3ML SOPN Inject weekly 0.25 mg for four weeks then increase to 0.5 mg weekly 3 mL 0   No current facility-administered medications for this visit.    Patient confirms/reports the following allergies:  No Known Allergies  No orders of the defined types were placed in this encounter.   AUTHORIZATION INFORMATION Primary Insurance: 1D#: Group #:  Secondary Insurance: 1D#: Group #:  SCHEDULE INFORMATION: Date:  12/05/2023 Time: Location:  [facility]

## 2023-11-04 NOTE — Telephone Encounter (Signed)
Pharmacy Patient Advocate Encounter   Received notification from Patient Advice Request messages that prior authorization for Ozempic (0.25 or 0.5 MG/DOSE) 2MG /3ML pen-injectors is required/requested.   Insurance verification completed.   The patient is insured through [facility] .   Per test claim: PA required; PA submitted to above mentioned insurance via CoverMyMeds Key/confirmation #/EOC  [id number] Status is pending

## 2023-11-07 NOTE — Telephone Encounter (Signed)
Pharmacy Patient Advocate Encounter  Received notification from [facility] that Prior Authorization for Ozempic (0.25 or 0.5 MG/DOSE) 2MG /3ML pen-injectors has been DENIED.  No reason given; No denial letter received via Fax or CMM. It has been requested and will be uploaded to the media tab once received.   PA #/Case ID/Reference #:  [id number]  *An appeal is available if the Dr requests it. Please let us know if you would like [place] to do one.

## 2023-11-08 ENCOUNTER — Encounter: Payer: Self-pay | Admitting: Family

## 2023-11-08 NOTE — Telephone Encounter (Signed)
Is there a way to look into GLP 1 and see if there is any savings cards or opportunities to save $$ to get approved/paid for? Pt has high deductible and told me that ozempic would be 800$ a month

## 2023-11-14 ENCOUNTER — Telehealth: Payer: Self-pay

## 2023-11-14 NOTE — Telephone Encounter (Signed)
PA request has been Submitted. New Encounter created for follow up. For additional info see Pharmacy Prior Auth telephone encounter from 02/03.

## 2023-11-14 NOTE — Telephone Encounter (Signed)
*  Primary  -previous request was denied due to chart notes not being submitted with claim  Pharmacy Patient Advocate Encounter   Received notification from Patient Advice Request messages that prior authorization for Ozempic (0.25 or 0.5 MG/DOSE) 2MG /3ML pen-injectors  is required/requested.   Insurance verification completed.   The patient is insured through [facility] .   Per test claim: PA required; PA submitted to above mentioned insurance via CoverMyMeds Key/confirmation #/EOC [facility] Status is pending

## 2023-11-21 ENCOUNTER — Ambulatory Visit
Admission: RE | Admit: 2023-11-21 | Discharge: 2023-11-21 | Disposition: A | Discharge status: Home / Self Care | Payer: BC Managed Care – PPO | Source: Ambulatory Visit | Attending: Family | Admitting: Family

## 2023-11-21 DIAGNOSIS — Z803 Family history of malignant neoplasm of breast: Secondary | ICD-10-CM | POA: Insufficient documentation

## 2023-11-21 DIAGNOSIS — Z1231 Encounter for screening mammogram for malignant neoplasm of breast: Secondary | ICD-10-CM | POA: Insufficient documentation

## 2023-11-22 ENCOUNTER — Ambulatory Visit (INDEPENDENT_AMBULATORY_CARE_PROVIDER_SITE_OTHER): Payer: BC Managed Care – PPO | Admitting: Family

## 2023-11-22 ENCOUNTER — Encounter: Payer: Self-pay | Admitting: Family

## 2023-11-22 ENCOUNTER — Telehealth: Payer: Self-pay | Admitting: Family

## 2023-11-22 VITALS — BP 122/74 | HR 79 | Temp 98.0°F | Ht 67.0 in | Wt 253.6 lb

## 2023-11-22 DIAGNOSIS — Z0000 Encounter for general adult medical examination without abnormal findings: Secondary | ICD-10-CM

## 2023-11-22 DIAGNOSIS — E1159 Type 2 diabetes mellitus with other circulatory complications: Secondary | ICD-10-CM

## 2023-11-22 DIAGNOSIS — I152 Hypertension secondary to endocrine disorders: Secondary | ICD-10-CM

## 2023-11-22 DIAGNOSIS — E119 Type 2 diabetes mellitus without complications: Secondary | ICD-10-CM

## 2023-11-22 DIAGNOSIS — E782 Mixed hyperlipidemia: Secondary | ICD-10-CM

## 2023-11-22 DIAGNOSIS — Z7985 Long-term (current) use of injectable non-insulin antidiabetic drugs: Secondary | ICD-10-CM

## 2023-11-22 MED ORDER — ROSUVASTATIN CALCIUM 10 MG PO TABS: 10.0000 mg | ORAL_TABLET | Freq: Every day | ORAL | 3 refills | Status: DC

## 2023-11-22 MED ORDER — OZEMPIC (0.25 OR 0.5 MG/DOSE) 2 MG/3ML ~~LOC~~ SOPN: 0.2500 mg | PEN_INJECTOR | SUBCUTANEOUS | 0 refills | Status: DC

## 2023-11-22 NOTE — Assessment & Plan Note (Signed)

## 2023-11-22 NOTE — Assessment & Plan Note (Signed)
Continue to Work on low cholesterol diet and exercise as tolerated

## 2023-11-22 NOTE — Assessment & Plan Note (Signed)
Improving Continue losartan 50 mg once daily.

## 2023-11-22 NOTE — Assessment & Plan Note (Addendum)
Increase to metformin XR 500 mg once daily, total 1000 mg in am.  If tolerating well increase to 500 mg xr at night time, with 1000 mg in am.  Then again if tolerating well increase to 1000 mg XR twice a day.  Will attempt ok to get authorization for ozempic as pt is diabetic.

## 2023-11-22 NOTE — Progress Notes (Signed)
Subjective:  Patient ID: Tamara Gibson, female    DOB: 1966/04/08  Age: 58 y.o. MRN: [id number]  Patient Care Team: Mort Sawyers, FNP as PCP - General (Family Medicine)   CC:  Chief Complaint  Patient presents with   Medical Management of Chronic Issues    HPI Tamara Gibson is a 58 y.o. female who presents today for an annual physical exam. She reports consuming a general diet.  Has been starting to walk throughout the week  She generally feels well. She reports sleeping well. She does not have additional problems to discuss today.   Vision:Within last year Dental:Receives regular dental care  Mammogram: 11/21/23 just yesterday no report as of yet.  Last pap: h/o complete hysterectomy  Colonoscopy:scheduled for 12/05/23.  Bone density scan: > 61 y/o   Pt is without acute concerns.  HTN: doing well on losartan 50 mg once daily. Average is typically 125/70. Lowest 116/59 highest 138/68.  Trying to also watch salt in her diet.  Wt Readings from Last 3 Encounters:  11/22/23 253 lb 9.6 oz (115 kg)  11/01/23 260 lb (117.9 kg)  05/25/16 240 lb (108.9 kg)   Diabetes: doing well average fasting glucose around 140's. Trying to work on portion and diet control, exercising more as well. Ozempic was too expensive she has a high deductible.   Advanced Directives Patient does not have advanced directives    DEPRESSION SCREENING    11/22/2023   11:00 AM 11/01/2023   12:13 PM  PHQ 2/9 Scores  PHQ - 2 Score 0 0  PHQ- 9 Score 0 1     ROS: Negative unless specifically indicated above in HPI.    Current Outpatient Medications:    Cholecalciferol 1.25 MG (50000 UT) TABS, Take 1 tablet by mouth once a week., Disp: 12 tablet, Rfl: 0   losartan (COZAAR) 50 MG tablet, Take 1 tablet (50 mg total) by mouth daily., Disp: 90 tablet, Rfl: 3   metFORMIN (GLUCOPHAGE-XR) 500 MG 24 hr tablet, Take 1 tablet (500 mg total) by mouth daily with breakfast., Disp: 90 tablet, Rfl: 3   omeprazole  (PRILOSEC) 20 MG capsule, Take 1 capsule (20 mg total) by mouth daily., Disp: 90 capsule, Rfl: 3   Semaglutide,0.25 or 0.5MG /DOS, (OZEMPIC, 0.25 OR 0.5 MG/DOSE,) 2 MG/3ML SOPN, Inject 0.25 mg into the skin once a week., Disp: 3 mL, Rfl: 0   rosuvastatin (CRESTOR) 10 MG tablet, Take 1 tablet (10 mg total) by mouth daily., Disp: 90 tablet, Rfl: 3    Objective:    BP 122/74   Pulse 79   Temp 98 F (36.7 C) (Oral)   Ht 5\' 7"  (1.702 m)   Wt 253 lb 9.6 oz (115 kg)   LMP 03/08/2004 (Approximate)   SpO2 98%   BMI 39.72 kg/m   BP Readings from Last 3 Encounters:  11/22/23 122/74  11/01/23 (!) 159/89  05/25/16 118/90      Physical Exam Constitutional:      General: She is not in acute distress.    Appearance: Normal appearance. She is obese. She is not ill-appearing.  HENT:     Head: Normocephalic.     Right Ear: Tympanic membrane normal.     Left Ear: Tympanic membrane normal.     Nose: Nose normal.     Mouth/Throat:     Mouth: Mucous membranes are moist.  Eyes:     Extraocular Movements: Extraocular movements intact.     Pupils: Pupils are equal, round, and  reactive to light.  Cardiovascular:     Rate and Rhythm: Normal rate and regular rhythm.  Pulmonary:     Effort: Pulmonary effort is normal.     Breath sounds: Normal breath sounds.  Abdominal:     General: Abdomen is flat. Bowel sounds are normal.     Palpations: Abdomen is soft.     Tenderness: There is no guarding or rebound.  Musculoskeletal:        General: Normal range of motion.     Cervical back: Normal range of motion.  Skin:    General: Skin is warm.     Capillary Refill: Capillary refill takes less than 2 seconds.  Neurological:     General: No focal deficit present.     Mental Status: She is alert.  Psychiatric:        Mood and Affect: Mood normal.        Behavior: Behavior normal.        Thought Content: Thought content normal.        Judgment: Judgment normal.    Title   Diabetic Foot Exam -  detailed Is there a history of foot ulcer?: No Is there a foot ulcer now?: No Is there swelling?: No Is there elevated skin temperature?: No Is there abnormal foot shape?: No Is there a claw toe deformity?: No Are the toenails long?: No Are the toenails thick?: No Are the toenails ingrown?: No Is the skin thin, fragile, shiny and hairless?": No Normal Range of Motion?: Yes Is there foot or ankle muscle weakness?: No Do you have pain in calf while walking?: No Are the shoes appropriate in style and fit?: Yes Can the patient see the bottom of their feet?: Yes Right Posterior Tibialis: Present Left posterior Tibialis: Present   Right Dorsalis Pedis: Present Left Dorsalis Pedis: Present     Semmes-Weinstein Monofilament Test "+" means "has sensation" and "-" means "no sensation"  R Foot Test Control: Pos L Foot Test Control: Pos   R Site 1-Great Toe: Pos L Site 1-Great Toe: Pos   R Site 4: Pos L Site 4: Pos   R site 5: Pos L Site 5: Pos  R Site 6: Pos L Site 6: Pos     Image components are not supported.   Image components are not supported. Image components are not supported.  [place] Comments           Assessment & Plan:  Controlled type 2 diabetes mellitus without complication, without long-term current use of insulin (HCC) Assessment & Plan: Increase to metformin XR 500 mg once daily, total 1000 mg in am.  If tolerating well increase to 500 mg xr at night time, with 1000 mg in am.  Then again if tolerating well increase to 1000 mg XR twice a day.  Will attempt ok to get authorization for ozempic as pt is diabetic.      Orders: -     Ozempic (0.25 or 0.5 MG/DOSE); Inject 0.25 mg into the skin once a week.  Dispense: 3 mL; Refill: 0  Mixed hyperlipidemia Assessment & Plan: Continue to Work on low cholesterol diet and exercise as tolerated   Orders: -     Rosuvastatin Calcium; Take 1 tablet (10 mg total) by mouth daily.  Dispense: 90 tablet; Refill:  3 -     Ozempic (0.25 or 0.5 MG/DOSE); Inject 0.25 mg into the skin once a week.  Dispense: 3 mL; Refill: 0  Hypertension associated with diabetes (  HCC) Assessment & Plan: Improving Continue losartan 50 mg once daily.   Orders: -     Ozempic (0.25 or 0.5 MG/DOSE); Inject 0.25 mg into the skin once a week.  Dispense: 3 mL; Refill: 0  Encounter for general adult medical examination without abnormal findings Assessment & Plan: Patient Counseling(The following topics were reviewed):  Preventative care handout given to pt  Health maintenance and immunizations reviewed. Please refer to Health maintenance section. Pt advised on safe sex, wearing seatbelts in car, and proper nutrition labwork ordered today for annual Dental health: Discussed importance of regular tooth brushing, flossing, and dental visits.        Follow-up: Return in about 3 months (around 02/19/2024) for f/u diabetes.   Mort Sawyers, FNP

## 2023-11-22 NOTE — Telephone Encounter (Signed)
Can we resubmit ozempic prior auth with todays note because she is diabetic, I made sure it was notated.

## 2023-11-22 NOTE — Patient Instructions (Signed)
Increase metformin to 1000 mg in am (two of the 500 mg tablets)   After one week if tolerating well increase to 1000 mg in am and 500 mg at night time.   If still tolerating well after another week increase to 1000 mg twice daily.    ------------------------------------ Let me know when you need a refill.

## 2023-11-23 ENCOUNTER — Telehealth: Payer: Self-pay

## 2023-11-23 NOTE — Telephone Encounter (Signed)
PA request has been Submitted. New Encounter created for follow up. For additional info see Pharmacy Prior Auth telephone encounter from 11/23/23.

## 2023-11-23 NOTE — Telephone Encounter (Signed)
Pharmacy Patient Advocate Encounter  Received notification from [facility] that Prior Authorization for Ozempic (0.25 or 0.5 MG/DOSE) 2MG /3ML pen-injectors has been CANCELLED due to: Updated Case Required.Closed by Provider   PA request has been  RESUBMITTED . New Encounter created for follow up. For additional info see Pharmacy Prior Auth telephone encounter from 11/23/23.

## 2023-11-23 NOTE — Telephone Encounter (Signed)
Pharmacy Patient Advocate Encounter   Received notification from Pt Calls Messages that prior authorization for Ozempic (0.25 or 0.5 MG/DOSE) 2MG /3ML pen-injectors is required/requested.   Insurance verification completed.   The patient is insured through [facility] .   Per test claim: PA required; PA submitted to above mentioned insurance via CoverMyMeds Key/confirmation #/EOC [id number] Status is pending

## 2023-11-24 ENCOUNTER — Other Ambulatory Visit (HOSPITAL_COMMUNITY): Payer: Self-pay

## 2023-11-24 ENCOUNTER — Encounter: Payer: Self-pay | Admitting: Family

## 2023-11-24 DIAGNOSIS — E119 Type 2 diabetes mellitus without complications: Secondary | ICD-10-CM

## 2023-11-24 DIAGNOSIS — E782 Mixed hyperlipidemia: Secondary | ICD-10-CM

## 2023-11-24 NOTE — Telephone Encounter (Signed)
Pharmacy Patient Advocate Encounter  Received notification from [facility] that Prior Authorization for Ozempic (0.25 or 0.5 MG/DOSE) 2MG /3ML pen-injectors has been CANCELLED due to: Authorization On File.Please note that authorization is already on file for the requested medication and is effective through (11/03/2024)    Per test claim: Refill too soon. PA is not needed at this time. Medication was filled 11/22/23. Next eligible fill date is 12/13/23.

## 2023-11-30 ENCOUNTER — Telehealth: Payer: Self-pay

## 2023-11-30 DIAGNOSIS — H25011 Cortical age-related cataract, right eye: Secondary | ICD-10-CM | POA: Diagnosis not present

## 2023-11-30 DIAGNOSIS — H2513 Age-related nuclear cataract, bilateral: Secondary | ICD-10-CM | POA: Diagnosis not present

## 2023-11-30 DIAGNOSIS — E119 Type 2 diabetes mellitus without complications: Secondary | ICD-10-CM | POA: Diagnosis not present

## 2023-11-30 LAB — HM DIABETES EYE EXAM

## 2023-11-30 MED ORDER — NA SULFATE-K SULFATE-MG SULF 17.5-3.13-1.6 GM/177ML PO SOLN: 1.0000 | Freq: Once | ORAL | 0 refills | Status: AC

## 2023-11-30 NOTE — Addendum Note (Signed)
 Addended by: Tawnya Crook on: 11/30/2023 11:00 AM   Modules accepted: Orders

## 2023-11-30 NOTE — Telephone Encounter (Signed)
 The patient called in to get her prep switched to another pharmacy because [facility] price was too high. The patient stated that she has been calling for the last two days and no one answer. The patient would like her prep to be sent to [facility] at [address] [place], [place] [address].

## 2023-11-30 NOTE — Telephone Encounter (Signed)
 Spoken to patient and sent Suprep to [facility] as requested. Using [facility] for a more affordable price.

## 2023-12-05 ENCOUNTER — Encounter
Admission: RE | Disposition: A | Discharge status: Home / Self Care | Payer: Self-pay | Source: Home / Self Care | Attending: Gastroenterology

## 2023-12-05 ENCOUNTER — Telehealth: Payer: Self-pay | Admitting: *Deleted

## 2023-12-05 ENCOUNTER — Ambulatory Visit
Admission: RE | Admit: 2023-12-05 | Discharge: 2023-12-05 | Disposition: A | Discharge status: Home / Self Care | Payer: BC Managed Care – PPO | Attending: Gastroenterology | Admitting: Gastroenterology

## 2023-12-05 ENCOUNTER — Telehealth: Payer: Self-pay

## 2023-12-05 ENCOUNTER — Other Ambulatory Visit: Payer: Self-pay | Admitting: *Deleted

## 2023-12-05 ENCOUNTER — Ambulatory Visit: Payer: BC Managed Care – PPO | Admitting: Anesthesiology

## 2023-12-05 DIAGNOSIS — Z1211 Encounter for screening for malignant neoplasm of colon: Secondary | ICD-10-CM

## 2023-12-05 DIAGNOSIS — Z538 Procedure and treatment not carried out for other reasons: Secondary | ICD-10-CM | POA: Diagnosis not present

## 2023-12-05 DIAGNOSIS — E119 Type 2 diabetes mellitus without complications: Secondary | ICD-10-CM | POA: Diagnosis not present

## 2023-12-05 SURGERY — COLONOSCOPY WITH PROPOFOL
Anesthesia: General

## 2023-12-05 MED ORDER — PEG 3350-KCL-NABCB-NACL-NASULF 236 G PO SOLR: 8000.0000 mL | Freq: Once | ORAL | 0 refills | Status: AC

## 2023-12-05 MED ORDER — SODIUM CHLORIDE 0.9 % IV SOLN: INTRAVENOUS | Status: DC

## 2023-12-05 NOTE — Telephone Encounter (Signed)
 Colonoscopy reschedule to Tuesday, 01/03/2024

## 2023-12-05 NOTE — Telephone Encounter (Signed)
 Patient called office to reschedule due not cleaned out. Requesting to reschedule to 01/03/2024. Recommend 2 day prep with [facility].   New instructions will be sent. Rx have been sent to [facility] as requested.

## 2023-12-05 NOTE — OR Nursing (Signed)
 Patient stated not clear. Talked to Dr Servando Snare and he suggested reschedule

## 2023-12-05 NOTE — Telephone Encounter (Signed)
 The patient called and left a voicemail requesting to reschedule her colonoscopy. I called her back to let her know that we received his message and sent the message to the nurse.

## 2023-12-05 NOTE — Anesthesia Preprocedure Evaluation (Signed)
 Anesthesia Evaluation  Patient identified by MRN, date of birth, ID band Patient awake    Reviewed: Allergy & Precautions, H&P , NPO status , Patient's Chart, lab work & pertinent test results, reviewed documented beta blocker date and time   Airway Mallampati: II   Neck ROM: full    Dental  (+) Poor Dentition   Pulmonary neg pulmonary ROS   Pulmonary exam normal        Cardiovascular negative cardio ROS Normal cardiovascular exam Rhythm:regular Rate:Normal     Neuro/Psych negative neurological ROS  negative psych ROS   GI/Hepatic negative GI ROS, Neg liver ROS,,,  Endo/Other  negative endocrine ROSdiabetes, Well Controlled    Renal/GU negative Renal ROS  negative genitourinary   Musculoskeletal   Abdominal   Peds  Hematology negative hematology ROS (+)   Anesthesia Other Findings Past Medical History: 2022: Diabetes mellitus without complication (HCC) No date: Gallstones 2023: GERD (gastroesophageal reflux disease) Past Surgical History: No date: TOTAL VAGINAL HYSTERECTOMY     Comment:  still with one ovary unsure which one 1992: TUBAL LIGATION   Reproductive/Obstetrics negative OB ROS                             Anesthesia Physical Anesthesia Plan  ASA: 3  Anesthesia Plan: General   Post-op Pain Management:    Induction:   PONV Risk Score and Plan:   Airway Management Planned:   Additional Equipment:   Intra-op Plan:   Post-operative Plan:   Informed Consent: I have reviewed the patients History and Physical, chart, labs and discussed the procedure including the risks, benefits and alternatives for the proposed anesthesia with the patient or authorized representative who has indicated his/her understanding and acceptance.     Dental Advisory Given  Plan Discussed with: CRNA  Anesthesia Plan Comments:        Anesthesia Quick Evaluation

## 2023-12-27 ENCOUNTER — Encounter: Payer: Self-pay | Admitting: Gastroenterology

## 2023-12-27 MED ORDER — METFORMIN HCL ER 500 MG PO TB24: 500.0000 mg | ORAL_TABLET | Freq: Two times a day (BID) | ORAL | 3 refills | Status: DC

## 2023-12-27 NOTE — Addendum Note (Signed)
 Addended by: Mort Sawyers on: 12/27/2023 01:17 PM   Modules accepted: Orders

## 2023-12-28 MED ORDER — ROSUVASTATIN CALCIUM 10 MG PO TABS: 10.0000 mg | ORAL_TABLET | Freq: Every day | ORAL | 3 refills | Status: AC

## 2023-12-28 NOTE — Addendum Note (Signed)
 Addended by: Mort Sawyers on: 12/28/2023 10:27 PM   Modules accepted: Orders

## 2024-01-03 ENCOUNTER — Ambulatory Visit
Admission: RE | Admit: 2024-01-03 | Discharge: 2024-01-03 | Disposition: A | Discharge status: Home / Self Care | Payer: BC Managed Care – PPO | Attending: Gastroenterology | Admitting: Gastroenterology

## 2024-01-03 ENCOUNTER — Ambulatory Visit: Admitting: Anesthesiology

## 2024-01-03 ENCOUNTER — Other Ambulatory Visit: Payer: Self-pay

## 2024-01-03 ENCOUNTER — Encounter
Admission: RE | Disposition: A | Discharge status: Home / Self Care | Payer: Self-pay | Source: Home / Self Care | Attending: Gastroenterology

## 2024-01-03 ENCOUNTER — Encounter: Payer: Self-pay | Admitting: Gastroenterology

## 2024-01-03 DIAGNOSIS — I10 Essential (primary) hypertension: Secondary | ICD-10-CM | POA: Diagnosis not present

## 2024-01-03 DIAGNOSIS — Z7984 Long term (current) use of oral hypoglycemic drugs: Secondary | ICD-10-CM | POA: Diagnosis not present

## 2024-01-03 DIAGNOSIS — K640 First degree hemorrhoids: Secondary | ICD-10-CM | POA: Insufficient documentation

## 2024-01-03 DIAGNOSIS — K635 Polyp of colon: Secondary | ICD-10-CM | POA: Diagnosis not present

## 2024-01-03 DIAGNOSIS — Z7985 Long-term (current) use of injectable non-insulin antidiabetic drugs: Secondary | ICD-10-CM | POA: Diagnosis not present

## 2024-01-03 DIAGNOSIS — Z1211 Encounter for screening for malignant neoplasm of colon: Secondary | ICD-10-CM

## 2024-01-03 DIAGNOSIS — E119 Type 2 diabetes mellitus without complications: Secondary | ICD-10-CM | POA: Diagnosis not present

## 2024-01-03 HISTORY — PX: COLONOSCOPY WITH PROPOFOL: SHX5780

## 2024-01-03 HISTORY — PX: POLYPECTOMY: SHX5525

## 2024-01-03 LAB — GLUCOSE, CAPILLARY: Glucose-Capillary: 180 mg/dL — ABNORMAL HIGH (ref 70–99)

## 2024-01-03 SURGERY — COLONOSCOPY WITH PROPOFOL
Anesthesia: General

## 2024-01-03 MED ORDER — PROPOFOL 10 MG/ML IV BOLUS
INTRAVENOUS | Status: DC | PRN
  Administered 2024-01-03 (×3): 50 mg via INTRAVENOUS

## 2024-01-03 MED ORDER — LIDOCAINE HCL (PF) 2 % IJ SOLN
INTRAMUSCULAR | Status: DC | PRN
  Administered 2024-01-03: 40 mg via INTRADERMAL

## 2024-01-03 MED ORDER — SODIUM CHLORIDE 0.9 % IV SOLN: INTRAVENOUS | Status: DC

## 2024-01-03 MED ORDER — PROPOFOL 1000 MG/100ML IV EMUL
INTRAVENOUS | Status: AC
  Filled 2024-01-03: qty 100

## 2024-01-03 MED ORDER — LIDOCAINE HCL (PF) 2 % IJ SOLN
INTRAMUSCULAR | Status: AC
  Filled 2024-01-03: qty 5

## 2024-01-03 NOTE — Anesthesia Preprocedure Evaluation (Addendum)
 Anesthesia Evaluation  Patient identified by MRN, date of birth, ID band Patient awake    Reviewed: Allergy & Precautions, NPO status , Patient's Chart, lab work & pertinent test results  History of Anesthesia Complications Negative for: history of anesthetic complications  Airway Mallampati: IV   Neck ROM: Full    Dental no notable dental hx.    Pulmonary neg pulmonary ROS   Pulmonary exam normal breath sounds clear to auscultation       Cardiovascular hypertension, Normal cardiovascular exam Rhythm:Regular Rate:Normal     Neuro/Psych negative neurological ROS     GI/Hepatic negative GI ROS,,,  Endo/Other  diabetes, Type 2  Obesity   Renal/GU negative Renal ROS     Musculoskeletal   Abdominal   Peds  Hematology negative hematology ROS (+)   Anesthesia Other Findings   Reproductive/Obstetrics                             Anesthesia Physical Anesthesia Plan  ASA: 2  Anesthesia Plan: General   Post-op Pain Management:    Induction: Intravenous  PONV Risk Score and Plan: 3 and Propofol infusion, TIVA and Treatment may vary due to age or medical condition  Airway Management Planned: Natural Airway  Additional Equipment:   Intra-op Plan:   Post-operative Plan:   Informed Consent: I have reviewed the patients History and Physical, chart, labs and discussed the procedure including the risks, benefits and alternatives for the proposed anesthesia with the patient or authorized representative who has indicated his/her understanding and acceptance.       Plan Discussed with: CRNA  Anesthesia Plan Comments: (LMA/GETA backup discussed.  Patient consented for risks of anesthesia including but not limited to:  - adverse reactions to medications - damage to eyes, teeth, lips or other oral mucosa - nerve damage due to positioning  - sore throat or hoarseness - damage to heart,  brain, nerves, lungs, other parts of body or loss of life  Informed patient about role of CRNA in peri- and intra-operative care.  Patient voiced understanding.)       Anesthesia Quick Evaluation

## 2024-01-03 NOTE — Transfer of Care (Signed)
 Immediate Anesthesia Transfer of Care Note  Patient: Tamara Gibson  Procedure(s) Performed: COLONOSCOPY WITH PROPOFOL POLYPECTOMY  Patient Location: PACU  Anesthesia Type:MAC  Level of Consciousness: drowsy  Airway & Oxygen Therapy: Patient Spontanous Breathing and Patient connected to nasal cannula oxygen  Post-op Assessment: Report given to RN and Post -op Vital signs reviewed and stable  Post vital signs: Reviewed and stable  Last Vitals:  Vitals Value Taken Time  BP 115/72 01/03/24 0845  Temp 36.3 C 01/03/24 0844  Pulse 106 01/03/24 0846  Resp 12 01/03/24 0846  SpO2 94 % 01/03/24 0846  Vitals shown include unfiled device data.  Last Pain:  Vitals:   01/03/24 0844  TempSrc: Temporal  PainSc: Asleep         Complications: No notable events documented.

## 2024-01-03 NOTE — Op Note (Signed)
 [facility] Patient Name: Tamara Gibson Procedure Date: 01/03/2024 8:19 AM MRN: [id number] Account #: [id number] Date of Birth: 1966-09-13 Admit Type: Outpatient Age: 58 Room: [facility] ENDO ROOM 4 Gender: Female Note Status: Finalized Instrument Name: Prentice Docker [id number] Procedure:             Colonoscopy Indications:           Screening for colorectal malignant neoplasm Providers:             Midge Minium MD, MD Referring MD:          Mort Sawyers (Referring MD) Medicines:             Propofol per Anesthesia Complications:         No immediate complications. Procedure:             Pre-Anesthesia Assessment:                        - Prior to the procedure, a History and Physical was                         performed, and patient medications and allergies were                         reviewed. The patient's tolerance of previous                         anesthesia was also reviewed. The risks and benefits                         of the procedure and the sedation options and risks                         were discussed with the patient. All questions were                         answered, and informed consent was obtained. Prior                         Anticoagulants: The patient has taken no anticoagulant                         or antiplatelet agents. ASA Grade Assessment: II - A                         patient with mild systemic disease. After reviewing                         the risks and benefits, the patient was deemed in                         satisfactory condition to undergo the procedure.                        After obtaining informed consent, the colonoscope was                         passed under direct vision. Throughout the procedure,  the patient's blood pressure, pulse, and oxygen                         saturations were monitored continuously. The                         Colonoscope was introduced through  the anus and                         advanced to the the cecum, identified by appendiceal                         orifice and ileocecal valve. The colonoscopy was                         performed without difficulty. The patient tolerated                         the procedure well. The quality of the bowel                         preparation was excellent. Findings:      The perianal and digital rectal examinations were normal.      A 4 mm polyp was found in the sigmoid colon. The polyp was sessile. The       polyp was removed with a cold snare. Resection and retrieval were       complete.      Non-bleeding internal hemorrhoids were found during retroflexion. The       hemorrhoids were Grade I (internal hemorrhoids that do not prolapse). Impression:            - One 4 mm polyp in the sigmoid colon, removed with a                         cold snare. Resected and retrieved.                        - Non-bleeding internal hemorrhoids. Recommendation:        - Discharge patient to home.                        - Resume previous diet.                        - Continue present medications.                        - Await pathology results.                        - If the pathology report reveals adenomatous tissue,                         then repeat the colonoscopy for surveillance in 7                         years. Procedure Code(s):     --- Professional ---  16109, Colonoscopy, flexible; with removal of                         tumor(s), polyp(s), or other lesion(s) by snare                         technique Diagnosis Code(s):     --- Professional ---                        Z12.11, Encounter for screening for malignant neoplasm                         of colon                        D12.5, Benign neoplasm of sigmoid colon CPT copyright 2022 American Medical Association. All rights reserved. The codes documented in this report are preliminary and upon coder review may   be revised to meet current compliance requirements. Midge Minium MD, MD 01/03/2024 8:42:39 AM This report has been signed electronically. Number of Addenda: 0 Note Initiated On: 01/03/2024 8:19 AM Scope Withdrawal Time: 0 hours 10 minutes 26 seconds  Total Procedure Duration: 0 hours 15 minutes 7 seconds  Estimated Blood Loss:  Estimated blood loss: none.      [facility]

## 2024-01-03 NOTE — H&P (Signed)
 Tamara Minium, MD [facility] [address] [place], [place] [address] Phone: [phone] Fax : [phone]  Primary Care Physician:  Mort Sawyers, FNP Primary Gastroenterologist:  Dr. Servando Snare  Pre-Procedure History & Physical: HPI:  Tamara Gibson is a 58 y.o. female is here for a screening colonoscopy.   Past Medical History:  Diagnosis Date   Diabetes mellitus without complication (HCC) 2022   Gallstones    GERD (gastroesophageal reflux disease) 2023    Past Surgical History:  Procedure Laterality Date   TOTAL VAGINAL HYSTERECTOMY     still with one ovary unsure which one   TUBAL LIGATION  1992    Prior to Admission medications   Medication Sig Start Date End Date Taking? Authorizing Provider  losartan (COZAAR) 50 MG tablet Take 1 tablet (50 mg total) by mouth daily. 11/01/23  Yes Dugal, Wyatt Mage, FNP  rosuvastatin (CRESTOR) 10 MG tablet Take 1 tablet (10 mg total) by mouth daily. 12/28/23  Yes Mort Sawyers, FNP  Cholecalciferol 1.25 MG (50000 UT) TABS Take 1 tablet by mouth once a week. 11/02/23   Mort Sawyers, FNP  metFORMIN (GLUCOPHAGE-XR) 500 MG 24 hr tablet Take 1 tablet (500 mg total) by mouth 2 (two) times daily with a meal. 12/27/23   Mort Sawyers, FNP  omeprazole (PRILOSEC) 20 MG capsule Take 1 capsule (20 mg total) by mouth daily. 11/01/23   Mort Sawyers, FNP  Semaglutide,0.25 or 0.5MG /DOS, (OZEMPIC, 0.25 OR 0.5 MG/DOSE,) 2 MG/3ML SOPN Inject 0.25 mg into the skin once a week. Patient not taking: Reported on 01/03/2024 11/22/23   Mort Sawyers, FNP    Allergies as of 12/05/2023   (No Known Allergies)    Family History  Problem Relation Age of Onset   Anxiety disorder Mother    Diabetes Father    Heart disease Father    Kidney disease Father    Breast cancer Sister 71   Arthritis Maternal Grandmother    Diabetes Maternal Grandmother    Diabetes Maternal Grandfather    Diabetes Paternal Grandmother    Diabetes Paternal Grandfather     Social  History   Socioeconomic History   Marital status: Divorced    Spouse name: Not on file   Number of children: 3   Years of education: Not on file   Highest education level: Associate degree: academic program  Occupational History    Comment: mental health client specialist, intakes  Tobacco Use   Smoking status: Never   Smokeless tobacco: Never  Vaping Use   Vaping status: Never Used  Substance and Sexual Activity   Alcohol use: Yes    Alcohol/week: 2.0 standard drinks of alcohol    Types: 2 Glasses of wine per week    Comment: rarely   Drug use: Never   Sexual activity: Not Currently    Partners: Male    Birth control/protection: Surgical  Other Topics Concern   Not on file  Social History Narrative   Three adult daughter    Three grandkids    Social Drivers of Health   Financial Resource Strain: Low Risk  (10/31/2023)   Overall Financial Resource Strain (CARDIA)    Difficulty of Paying Living Expenses: Not very hard  Food Insecurity: No Food Insecurity (10/31/2023)   Hunger Vital Sign    Worried About Running Out of Food in the Last Year: Never true    Ran Out of Food in the Last Year: Never true  Transportation Needs: No Transportation Needs (10/31/2023)   PRAPARE -  Administrator, Civil Service (Medical): No    Lack of Transportation (Non-Medical): No  Physical Activity: Unknown (10/31/2023)   Exercise Vital Sign    Days of Exercise per Week: 0 days    Minutes of Exercise per Session: Not on file  Stress: No Stress Concern Present (10/31/2023)   [employer] of Occupational Health - Occupational Stress Questionnaire    Feeling of Stress : Only a little  Social Connections: Socially Isolated (10/31/2023)   Social Connection and Isolation Panel [NHANES]    Frequency of Communication with Friends and Family: More than three times a week    Frequency of Social Gatherings with Friends and Family: Once a week    Attends Religious Services: Never    Automotive engineer or Organizations: No    Attends Engineer, structural: Not on file    Marital Status: Divorced  Catering manager Violence: Not on file    Review of Systems: See HPI, otherwise negative ROS  Physical Exam: BP (!) 140/98   Temp (!) 97.3 F (36.3 C) (Temporal)   Resp 18   Ht 5\' 7"  (1.702 m)   Wt 113.9 kg   LMP 03/08/2004 (Approximate)   SpO2 98%   BMI 39.31 kg/m  General:   Alert,  pleasant and cooperative in NAD Head:  Normocephalic and atraumatic. Neck:  Supple; no masses or thyromegaly. Lungs:  Clear throughout to auscultation.    Heart:  Regular rate and rhythm. Abdomen:  Soft, nontender and nondistended. Normal bowel sounds, without guarding, and without rebound.   Neurologic:  Alert and  oriented x4;  grossly normal neurologically.  Impression/Plan: Tamara Gibson is now here to undergo a screening colonoscopy.  Risks, benefits, and alternatives regarding colonoscopy have been reviewed with the patient.  Questions have been answered.  All parties agreeable.

## 2024-01-03 NOTE — Anesthesia Postprocedure Evaluation (Signed)
 Anesthesia Post Note  Patient: Tamara Gibson  Procedure(s) Performed: COLONOSCOPY WITH PROPOFOL POLYPECTOMY  Patient location during evaluation: PACU Anesthesia Type: General Level of consciousness: awake and alert, oriented and patient cooperative Pain management: pain level controlled Vital Signs Assessment: post-procedure vital signs reviewed and stable Respiratory status: spontaneous breathing, nonlabored ventilation and respiratory function stable Cardiovascular status: blood pressure returned to baseline and stable Postop Assessment: adequate PO intake Anesthetic complications: no   No notable events documented.   Last Vitals:  Vitals:   01/03/24 0854 01/03/24 0906  BP: 113/66 104/64  Pulse: (!) 110 (!) 103  Resp: 19 17  Temp:    SpO2: 97% 97%    Last Pain:  Vitals:   01/03/24 0906  TempSrc:   PainSc: 0-No pain                 Reed Breech

## 2024-01-04 ENCOUNTER — Encounter: Payer: Self-pay | Admitting: Gastroenterology

## 2024-01-04 LAB — SURGICAL PATHOLOGY

## 2024-01-04 NOTE — Progress Notes (Signed)
 noted

## 2024-01-05 ENCOUNTER — Encounter: Payer: Self-pay | Admitting: Gastroenterology

## 2024-01-05 NOTE — Progress Notes (Signed)
 noted

## 2024-01-06 NOTE — Telephone Encounter (Signed)
 You will need to see in office right?

## 2024-01-09 NOTE — Telephone Encounter (Signed)
 Please schedule for appt for acute concerns with cough

## 2024-01-10 NOTE — Telephone Encounter (Signed)
Lvm to call to schedule appt

## 2024-01-11 NOTE — Telephone Encounter (Signed)
 Lvm to schedule

## 2024-01-11 NOTE — Telephone Encounter (Signed)
 LVM and sent mychart message

## 2024-03-01 ENCOUNTER — Ambulatory Visit: Admitting: Family

## 2024-04-10 DIAGNOSIS — R6889 Other general symptoms and signs: Secondary | ICD-10-CM | POA: Diagnosis not present

## 2024-05-11 DIAGNOSIS — R6889 Other general symptoms and signs: Secondary | ICD-10-CM | POA: Diagnosis not present

## 2024-06-11 DIAGNOSIS — R6889 Other general symptoms and signs: Secondary | ICD-10-CM | POA: Diagnosis not present

## 2024-06-14 ENCOUNTER — Telehealth: Payer: Self-pay | Admitting: Family

## 2024-06-14 NOTE — Telephone Encounter (Signed)
 Copied from CRM [id number]. Topic: General - Billing Inquiry >> Jun 14, 2024  8:45 AM Deaijah H wrote: Reason for CRM: Patient would like to know if there is an outstanding balance on her account. Please call [phone]

## 2024-07-02 ENCOUNTER — Other Ambulatory Visit: Payer: Self-pay | Admitting: Family

## 2024-07-02 ENCOUNTER — Ambulatory Visit: Admitting: Family

## 2024-07-02 DIAGNOSIS — E119 Type 2 diabetes mellitus without complications: Secondary | ICD-10-CM

## 2024-07-03 ENCOUNTER — Encounter: Payer: Self-pay | Admitting: Family

## 2024-07-03 DIAGNOSIS — E119 Type 2 diabetes mellitus without complications: Secondary | ICD-10-CM

## 2024-07-06 ENCOUNTER — Other Ambulatory Visit: Payer: Self-pay | Admitting: Family

## 2024-07-06 ENCOUNTER — Encounter: Payer: Self-pay | Admitting: Family

## 2024-07-06 DIAGNOSIS — E119 Type 2 diabetes mellitus without complications: Secondary | ICD-10-CM

## 2024-07-06 MED ORDER — METFORMIN HCL ER 500 MG PO TB24: 500.0000 mg | ORAL_TABLET | Freq: Two times a day (BID) | ORAL | 0 refills | Status: DC

## 2024-07-09 MED ORDER — METFORMIN HCL ER 500 MG PO TB24: 500.0000 mg | ORAL_TABLET | Freq: Two times a day (BID) | ORAL | 0 refills | Status: DC

## 2024-07-11 DIAGNOSIS — R6889 Other general symptoms and signs: Secondary | ICD-10-CM | POA: Diagnosis not present

## 2024-07-24 ENCOUNTER — Ambulatory Visit: Admitting: Family

## 2024-08-24 NOTE — Progress Notes (Signed)
 Tamara Gibson                                          MRN: [id number]   08/24/2024   The [facility] Quality Team Specialist reviewed this patient medical record for the purposes of chart review for care gap closure. The following were reviewed: chart review for care gap closure-glycemic status assessment and kidney health evaluation for diabetes:eGFR  and uACR.    [facility] Quality Team

## 2024-09-10 DIAGNOSIS — R6889 Other general symptoms and signs: Secondary | ICD-10-CM | POA: Diagnosis not present

## 2024-10-18 ENCOUNTER — Encounter: Payer: Self-pay | Admitting: Family

## 2024-10-18 ENCOUNTER — Other Ambulatory Visit: Payer: Self-pay | Admitting: Family

## 2024-10-18 DIAGNOSIS — I152 Hypertension secondary to endocrine disorders: Secondary | ICD-10-CM

## 2024-10-18 DIAGNOSIS — E119 Type 2 diabetes mellitus without complications: Secondary | ICD-10-CM

## 2024-10-19 MED ORDER — LOSARTAN POTASSIUM 50 MG PO TABS: 50.0000 mg | ORAL_TABLET | Freq: Every day | ORAL | 0 refills | Status: AC

## 2024-10-19 MED ORDER — METFORMIN HCL ER 500 MG PO TB24: 500.0000 mg | ORAL_TABLET | Freq: Two times a day (BID) | ORAL | 0 refills | Status: AC

## 2024-11-12 ENCOUNTER — Ambulatory Visit: Admitting: Family

## 2024-12-04 ENCOUNTER — Ambulatory Visit: Admitting: Family
